# Patient Record
Sex: Male | Born: 1965
Health system: Southern US, Community
[De-identification: ages and names within clinical notes are randomized; demographics above are authoritative.]

## PROBLEM LIST (undated history)

## (undated) DIAGNOSIS — R519 Headache, unspecified: Secondary | ICD-10-CM

## (undated) DIAGNOSIS — M5416 Radiculopathy, lumbar region: Secondary | ICD-10-CM

## (undated) DIAGNOSIS — R51 Headache: Secondary | ICD-10-CM

## (undated) HISTORY — DX: Headache: R51

## (undated) HISTORY — PX: HAND TENDON SURGERY: SHX663

## (undated) HISTORY — DX: Headache, unspecified: R51.9

---

## 2016-08-23 HISTORY — PX: BACK SURGERY: SHX140

## 2017-02-12 ENCOUNTER — Other Ambulatory Visit (HOSPITAL_BASED_OUTPATIENT_CLINIC_OR_DEPARTMENT_OTHER): Payer: Self-pay | Admitting: Internal Medicine

## 2017-02-12 DIAGNOSIS — R51 Headache: Principal | ICD-10-CM

## 2017-02-12 DIAGNOSIS — R519 Headache, unspecified: Secondary | ICD-10-CM

## 2017-02-15 ENCOUNTER — Ambulatory Visit (HOSPITAL_BASED_OUTPATIENT_CLINIC_OR_DEPARTMENT_OTHER)
Admission: RE | Admit: 2017-02-15 | Discharge: 2017-02-15 | Disposition: A | Discharge status: Home / Self Care | Payer: 59 | Source: Ambulatory Visit | Attending: Internal Medicine | Admitting: Internal Medicine

## 2017-02-15 DIAGNOSIS — I6782 Cerebral ischemia: Secondary | ICD-10-CM | POA: Diagnosis not present

## 2017-02-15 DIAGNOSIS — R51 Headache: Secondary | ICD-10-CM | POA: Diagnosis not present

## 2017-02-15 DIAGNOSIS — R519 Headache, unspecified: Secondary | ICD-10-CM

## 2017-02-15 DIAGNOSIS — J349 Unspecified disorder of nose and nasal sinuses: Secondary | ICD-10-CM | POA: Insufficient documentation

## 2017-02-18 ENCOUNTER — Encounter: Payer: Self-pay | Admitting: Neurology

## 2017-02-18 ENCOUNTER — Ambulatory Visit (INDEPENDENT_AMBULATORY_CARE_PROVIDER_SITE_OTHER): Payer: 59 | Admitting: Neurology

## 2017-02-18 VITALS — BP 138/84 | HR 97 | Ht 73.0 in | Wt 167.0 lb

## 2017-02-18 DIAGNOSIS — G44019 Episodic cluster headache, not intractable: Secondary | ICD-10-CM | POA: Insufficient documentation

## 2017-02-18 MED ORDER — RIZATRIPTAN BENZOATE 10 MG PO TBDP: 10.0000 mg | ORAL_TABLET | ORAL | 6 refills | Status: DC | PRN

## 2017-02-18 MED ORDER — DIVALPROEX SODIUM ER 250 MG PO TB24: 250.0000 mg | ORAL_TABLET | Freq: Every day | ORAL | 11 refills | Status: DC

## 2017-02-18 NOTE — Progress Notes (Signed)
PATIENT: Zachary Mueller DOB: [birth year]  Chief Complaint  Patient presents with  . Headache    He started having left-sided headaches seven weeks ago.  The pain is worse at night and causes him difficulty sleeping.  He was initially treated for a sinus infection but his headaches did not improve.  He is now taking amitriptyline 25mg  at bedtime which has improved the frequency.  He has been using methocarbamol and oxycodone to treat his most severe pain (meds left over from his back surgery earlier this year).  . PCP    Terri PiedraForcucci, Courtney, PA-C     HISTORICAL  Zachary Mueller is a 51 years old male, seen in refer by PA Barry DienesForcucci, Courtney evaluation of headache, initial evaluation was on February 18, 2017.  I reviewed and summarized the referring note, he has history of anxiety, denied previous history of headache.  Since October 2018, without clear triggers, he develop current headaches, his headache wake  him up about 2 hours after sleep, around 1 AM, left side retro-orbital area severe pounding pain, radiating towards left temporal, left occipital region, there was no significant autonomic features, such as rhinorrhea, or tearing, headache last about 1 hour if he take hydrocodone, otherwise it can last for 4 hours,  At the beginning, he has headache almost every night, then about 4 times each week, on February 11, 2017, he was given prescription of amitriptyline 25 mg every night, he is now having headache about once every night,  In between the headache, he denies focal signs,  I have personally reviewed MRI of brain February 15, 2017, that was normal.  REVIEW OF SYSTEMS: Full 14 system review of systems performed and notable only for headache, fatigue  ALLERGIES: Allergies  Allergen Reactions  . Naproxen Anaphylaxis and Hives    HOME MEDICATIONS: Current Outpatient Medications  Medication Sig Dispense Refill  . amitriptyline (ELAVIL) 25 MG tablet Take 25 mg by mouth daily.  1    . IBUPROFEN PO Take by mouth as needed.    . methocarbamol (ROBAXIN) 500 MG tablet Take 500 mg by mouth as needed for muscle spasms.    Marland Kitchen. oxyCODONE-acetaminophen (PERCOCET/ROXICET) 5-325 MG tablet Take by mouth as needed for severe pain.    . Pseudoephedrine HCl (SUDAFED PO) Take by mouth as needed.     No current facility-administered medications for this visit.     PAST MEDICAL HISTORY: Past Medical History:  Diagnosis Date  . Headache     PAST SURGICAL HISTORY: Past Surgical History:  Procedure Laterality Date  . BACK SURGERY  08/2016  . HAND TENDON SURGERY Right     FAMILY HISTORY: Family History  Problem Relation Age of Onset  . Healthy Mother   . Healthy Father     SOCIAL HISTORY:  Social History   Socioeconomic History  . Marital status: Married    Spouse name: Not on file  . Number of children: 3  . Years of education: 7414  . Highest education level: Associate degree: academic program  Social Needs  . Financial resource strain: Not on file  . Food insecurity - worry: Not on file  . Food insecurity - inability: Not on file  . Transportation needs - medical: Not on file  . Transportation needs - non-medical: Not on file  Occupational History  . Occupation: Nurse, adultManufacturing engineer  Tobacco Use  . Smoking status: Current Every Day Smoker    Packs/day: 0.50    Types: Cigarettes  . Smokeless tobacco: Never  Used  Substance and Sexual Activity  . Alcohol use: Yes    Comment: 2 beers per day  . Drug use: No  . Sexual activity: Not on file  Other Topics Concern  . Not on file  Social History Narrative   Lives at home with his wife.   Right-handed.   4-5 cups of caffeine per day.     PHYSICAL EXAM   Vitals:   02/18/17 0850  BP: 138/84  Pulse: 97  Weight: 167 lb (75.8 kg)  Height: 6\' 1"  (1.854 m)    Not recorded      Body mass index is 22.03 kg/m.  PHYSICAL EXAMNIATION:  Gen: NAD, conversant, well nourised, obese, well groomed                      Cardiovascular: Regular rate rhythm, no peripheral edema, warm, nontender. Eyes: Conjunctivae clear without exudates or hemorrhage Neck: Supple, no carotid bruits. Pulmonary: Clear to auscultation bilaterally   NEUROLOGICAL EXAM:  MENTAL STATUS: Speech:    Speech is normal; fluent and spontaneous with normal comprehension.  Cognition:     Orientation to time, place and person     Normal recent and remote memory     Normal Attention span and concentration     Normal Language, naming, repeating,spontaneous speech     Fund of knowledge   CRANIAL NERVES: CN II: Visual fields are full to confrontation. Fundoscopic exam is normal with sharp discs and no vascular changes. Pupils are round equal and briskly reactive to light. CN III, IV, VI: extraocular movement are normal. No ptosis. CN V: Facial sensation is intact to pinprick in all 3 divisions bilaterally. Corneal responses are intact.  CN VII: Face is symmetric with normal eye closure and smile. CN VIII: Hearing is normal to rubbing fingers CN IX, X: Palate elevates symmetrically. Phonation is normal. CN XI: Head turning and shoulder shrug are intact CN XII: Tongue is midline with normal movements and no atrophy.  MOTOR: There is no pronator drift of out-stretched arms. Muscle bulk and tone are normal. Muscle strength is normal.  REFLEXES: Reflexes are 2+ and symmetric at the biceps, triceps, knees, and ankles. Plantar responses are flexor.  SENSORY: Intact to light touch, pinprick, positional sensation and vibratory sensation are intact in fingers and toes.  COORDINATION: Rapid alternating movements and fine finger movements are intact. There is no dysmetria on finger-to-nose and heel-knee-shin.    GAIT/STANCE: Posture is normal. Gait is steady with normal steps, base, arm swing, and turning. Heel and toe walking are normal. Tandem gait is normal.  Romberg is absent.   DIAGNOSTIC DATA (LABS, IMAGING, TESTING) - I  reviewed patient records, labs, notes, testing and imaging myself where available.   ASSESSMENT AND PLAN  Zachary Mueller is a 51 y.o. male   Left side headache,  Has cluster headache features,  Add on Depakote ER 250 mg every night, keep Elavil at 25 mg every night  Maxalt dissolvable as needed  Laboratory evaluations   Levert FeinsteinYijun Nayel Purdy, M.D. Ph.D.  [facility] [address], [address] [place], [place] [address] Ph: [phone]) [phone] Fax: [phone]  CC: Terri PiedraForcucci, Courtney, PA-C

## 2017-02-19 ENCOUNTER — Telehealth: Payer: Self-pay | Admitting: *Deleted

## 2017-02-19 LAB — CBC WITH DIFFERENTIAL/PLATELET
BASOS ABS: 0 10*3/uL (ref 0.0–0.2)
Basos: 1 %
EOS (ABSOLUTE): 0.4 10*3/uL (ref 0.0–0.4)
EOS: 6 %
HEMOGLOBIN: 16.1 g/dL (ref 13.0–17.7)
Hematocrit: 48.7 % (ref 37.5–51.0)
IMMATURE GRANS (ABS): 0 10*3/uL (ref 0.0–0.1)
IMMATURE GRANULOCYTES: 0 %
LYMPHS: 24 %
Lymphocytes Absolute: 1.5 10*3/uL (ref 0.7–3.1)
MCH: 31.1 pg (ref 26.6–33.0)
MCHC: 33.1 g/dL (ref 31.5–35.7)
MCV: 94 fL (ref 79–97)
MONOCYTES: 13 %
Monocytes Absolute: 0.8 10*3/uL (ref 0.1–0.9)
NEUTROS PCT: 56 %
Neutrophils Absolute: 3.5 10*3/uL (ref 1.4–7.0)
PLATELETS: 247 10*3/uL (ref 150–379)
RBC: 5.17 x10E6/uL (ref 4.14–5.80)
RDW: 14 % (ref 12.3–15.4)
WBC: 6.2 10*3/uL (ref 3.4–10.8)

## 2017-02-19 LAB — COMPREHENSIVE METABOLIC PANEL
ALBUMIN: 4.6 g/dL (ref 3.5–5.5)
ALT: 31 IU/L (ref 0–44)
AST: 35 IU/L (ref 0–40)
Albumin/Globulin Ratio: 2.3 — ABNORMAL HIGH (ref 1.2–2.2)
Alkaline Phosphatase: 82 IU/L (ref 39–117)
BILIRUBIN TOTAL: 0.2 mg/dL (ref 0.0–1.2)
BUN/Creatinine Ratio: 13 (ref 9–20)
BUN: 13 mg/dL (ref 6–24)
CALCIUM: 9.7 mg/dL (ref 8.7–10.2)
CO2: 24 mmol/L (ref 20–29)
Chloride: 102 mmol/L (ref 96–106)
Creatinine, Ser: 1.02 mg/dL (ref 0.76–1.27)
GFR calc Af Amer: 97 mL/min/{1.73_m2} (ref >59)
GFR, EST NON AFRICAN AMERICAN: 84 mL/min/{1.73_m2} (ref >59)
GLUCOSE: 73 mg/dL (ref 65–99)
Globulin, Total: 2 g/dL (ref 1.5–4.5)
POTASSIUM: 4.6 mmol/L (ref 3.5–5.2)
SODIUM: 143 mmol/L (ref 134–144)
TOTAL PROTEIN: 6.6 g/dL (ref 6.0–8.5)

## 2017-02-19 LAB — TSH: TSH: 0.954 u[IU]/mL (ref 0.450–4.500)

## 2017-02-19 LAB — C-REACTIVE PROTEIN: CRP: 0.4 mg/L (ref 0.0–4.9)

## 2017-02-19 LAB — SEDIMENTATION RATE: SED RATE: 2 mm/h (ref 0–30)

## 2017-02-19 NOTE — Telephone Encounter (Signed)
-----   Message from Levert FeinsteinYijun Yan, MD sent at 02/19/2017  1:16 PM EST ----- Please call patient for normal laboratory result

## 2017-02-19 NOTE — Telephone Encounter (Signed)
Spoke to patient he is aware of results.

## 2017-04-17 ENCOUNTER — Encounter: Payer: Self-pay | Admitting: Neurology

## 2017-04-17 ENCOUNTER — Ambulatory Visit (INDEPENDENT_AMBULATORY_CARE_PROVIDER_SITE_OTHER): Payer: 59 | Admitting: Neurology

## 2017-04-17 VITALS — BP 122/81 | HR 92 | Ht 73.0 in | Wt 169.0 lb

## 2017-04-17 DIAGNOSIS — G44019 Episodic cluster headache, not intractable: Secondary | ICD-10-CM | POA: Diagnosis not present

## 2017-04-17 MED ORDER — DIHYDROERGOTAMINE MESYLATE 4 MG/ML NA SOLN: 1.0000 | NASAL | 11 refills | Status: DC | PRN

## 2017-04-17 NOTE — Progress Notes (Signed)
PATIENT: Zachary Mueller DOB: 1965/09/15  Chief Complaint  Patient presents with  . Headache    His headache frequency has improved.  His last headache was the last week in November.  Maxalt was not helpful for him.  He is currently taking amitriptyline 27m at bedtime one day then the next day he takes Depakote ER 2560mat bedtime.     HISTORICAL  Zachary Mueller a [age] old male, seen in refer by PA FoLonia SkinnerCourtney evaluation of headache, initial evaluation was on February 18, 2017.  I reviewed and summarized the referring note, he has history of anxiety, denied previous history of headache.  Since October 2018, without clear triggers, he develop current headaches, his headache wake  him up about 2 hours after sleep, around 1 AM, left side retro-orbital area severe pounding pain, radiating towards left temporal, left occipital region, there was no significant autonomic features, such as rhinorrhea, or tearing, headache last about 1 hour if he take hydrocodone, otherwise it can last for 4 hours,  At the beginning, he has headache almost every night, then about 4 times each week, on February 11, 2017, he was given prescription of amitriptyline 25 mg every night, he is now having headache about once every night,  In between the headache, he denies focal signs,  I have personally reviewed MRI of brain February 15, 2017, that was normal.  UPDATE Apr 17 2017: Laboratory evaluation in November 2018 showed normal C-reactive protein and ESR TSH CMP, CBC  His headache overall has much improved, had a headache was the end of November, is still taking amitriptyline alternating with Depakote ER 250 mg every night, he has tried Maxalt as needed four times, it did not help his headache,  REVIEW OF SYSTEMS: Full 14 system review of systems performed and notable only for headache, fatigue  ALLERGIES: Allergies  Allergen Reactions  . Naproxen Anaphylaxis and Hives    HOME  MEDICATIONS: Current Outpatient Medications  Medication Sig Dispense Refill  . amitriptyline (ELAVIL) 25 MG tablet Take 25 mg by mouth daily.  1  . divalproex (DEPAKOTE ER) 250 MG 24 hr tablet Take 1 tablet (250 mg total) by mouth daily. 30 tablet 11  . IBUPROFEN PO Take by mouth as needed.    . methocarbamol (ROBAXIN) 500 MG tablet Take 500 mg by mouth as needed for muscle spasms.    . Marland KitchenxyCODONE-acetaminophen (PERCOCET/ROXICET) 5-325 MG tablet Take by mouth as needed for severe pain.    . Pseudoephedrine HCl (SUDAFED PO) Take by mouth as needed.     No current facility-administered medications for this visit.     PAST MEDICAL HISTORY: Past Medical History:  Diagnosis Date  . Headache     PAST SURGICAL HISTORY: Past Surgical History:  Procedure Laterality Date  . BACK SURGERY  08/2016  . HAND TENDON SURGERY Right     FAMILY HISTORY: Family History  Problem Relation Age of Onset  . Healthy Mother   . Healthy Father     SOCIAL HISTORY:  Social History   Socioeconomic History  . Marital status: Married    Spouse name: Not on file  . Number of children: 3  . Years of education: 1466. Highest education level: Associate degree: academic program  Social Needs  . Financial resource strain: Not on file  . Food insecurity - worry: Not on file  . Food insecurity - inability: Not on file  . Transportation needs - medical: Not on file  .  Transportation needs - non-medical: Not on file  Occupational History  . Occupation: Charity fundraiser  Tobacco Use  . Smoking status: Current Every Day Smoker    Packs/day: 0.50    Types: Cigarettes  . Smokeless tobacco: Never Used  Substance and Sexual Activity  . Alcohol use: Yes    Comment: 2 beers per day  . Drug use: No  . Sexual activity: Not on file  Other Topics Concern  . Not on file  Social History Narrative   Lives at home with his wife.   Right-handed.   4-5 cups of caffeine per day.     PHYSICAL EXAM    Vitals:   04/17/17 0910  BP: 122/81  Pulse: 92  Weight: 169 lb (76.7 kg)  Height: _0  (1.854 m)    Not recorded      Body mass index is 22.3 kg/m.  PHYSICAL EXAMNIATION:  Gen: NAD, conversant, well nourised, obese, well groomed                     Cardiovascular: Regular rate rhythm, no peripheral edema, warm, nontender. Eyes: Conjunctivae clear without exudates or hemorrhage Neck: Supple, no carotid bruits. Pulmonary: Clear to auscultation bilaterally   NEUROLOGICAL EXAM:  MENTAL STATUS: Speech:    Speech is normal; fluent and spontaneous with normal comprehension.  Cognition:     Orientation to time, place and person     Normal recent and remote memory     Normal Attention span and concentration     Normal Language, naming, repeating,spontaneous speech     Fund of knowledge   CRANIAL NERVES: CN II: Visual fields are full to confrontation. Fundoscopic exam is normal with sharp discs and no vascular changes. Pupils are round equal and briskly reactive to light. CN III, IV, VI: extraocular movement are normal. No ptosis. CN V: Facial sensation is intact to pinprick in all 3 divisions bilaterally. Corneal responses are intact.  CN VII: Face is symmetric with normal eye closure and smile. CN VIII: Hearing is normal to rubbing fingers CN IX, X: Palate elevates symmetrically. Phonation is normal. CN XI: Head turning and shoulder shrug are intact CN XII: Tongue is midline with normal movements and no atrophy.  MOTOR: There is no pronator drift of out-stretched arms. Muscle bulk and tone are normal. Muscle strength is normal.  REFLEXES: Reflexes are 2+ and symmetric at the biceps, triceps, knees, and ankles. Plantar responses are flexor.  SENSORY: Intact to light touch, pinprick, positional sensation and vibratory sensation are intact in fingers and toes.  COORDINATION: Rapid alternating movements and fine finger movements are intact. There is no dysmetria on  finger-to-nose and heel-knee-shin.    GAIT/STANCE: Posture is normal. Gait is steady with normal steps, base, arm swing, and turning. Heel and toe walking are normal. Tandem gait is normal.  Romberg is absent.   DIAGNOSTIC DATA (LABS, IMAGING, TESTING) - I reviewed patient records, labs, notes, testing and imaging myself where available.   ASSESSMENT AND PLAN  Jatavious Peppard is a 52 y.o. male   Left side headache,  With mixed feature of cluster headache and migraine  Has much improved  Tapering of Depakote ER 250 mg every night and Elavil at 25 mg every night  Maxalt did not help  Give him prescription of migranal nasal spray as needed   Marcial Pacas, M.D. Ph.D.  [facility] [address] [place], [place] [address] Ph: [phone] Fax: [phone]  CC: Forcucci,  Loma Sousa, PA-C

## 2017-04-23 ENCOUNTER — Encounter: Payer: Self-pay | Admitting: *Deleted

## 2017-04-23 ENCOUNTER — Telehealth: Payer: Self-pay | Admitting: *Deleted

## 2017-04-23 NOTE — Telephone Encounter (Signed)
Migranal nasal spray was denied but we have initiated an appeal with his insurance company.  Left message requesting a call back to notify him that is medication is under review for coverage.

## 2017-04-23 NOTE — Telephone Encounter (Signed)
Spoke to patient - he is aware that we have started an appeal for his Migranal.

## 2017-04-28 ENCOUNTER — Encounter: Payer: Self-pay | Admitting: *Deleted

## 2017-04-28 MED ORDER — SUMATRIPTAN SUCCINATE 100 MG PO TABS: ORAL_TABLET | ORAL | 5 refills | Status: DC

## 2017-04-28 NOTE — Addendum Note (Signed)
Addended by: Lilla ShookKIRKMAN, Chanler Schreiter C on: 04/28/2017 02:48 PM   Modules accepted: Orders

## 2017-04-28 NOTE — Telephone Encounter (Signed)
Migranal nasal spray denied after appeal.  His insurance plan requires him to try at least two oral triptans and he has only tried rizatriptan.  Per vo by Dr. Terrace ArabiaYan, provide rx for sumatriptan 100mg , per migraine protocol.  Called patient and he is agreeable to this change.  New rx sent to the pharmacy.

## 2017-10-15 ENCOUNTER — Ambulatory Visit: Payer: 59 | Admitting: Neurology

## 2018-05-20 IMAGING — MR MR HEAD W/O CM
10 of 11 series · 42 of 48 positions shown · non-contrast
Comparison: None.

CLINICAL DATA: 51 y/o M; 6 weeks of left temporal and parietal
headaches.

EXAM:
MRI HEAD WITHOUT CONTRAST
TECHNIQUE: Multiplanar, multiecho pulse sequences of the brain and surrounding
structures were obtained without intravenous contrast.

[Series 2: T1 · sagittal · 5.0mm · 0.49mm/px · 2 of 23 slices shown]
[im 1/23]
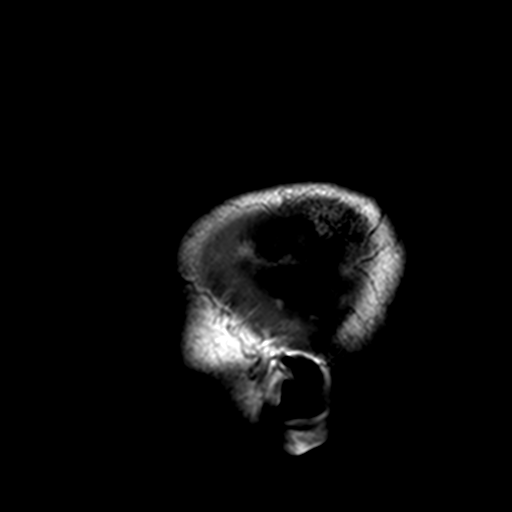
[im 23/23]
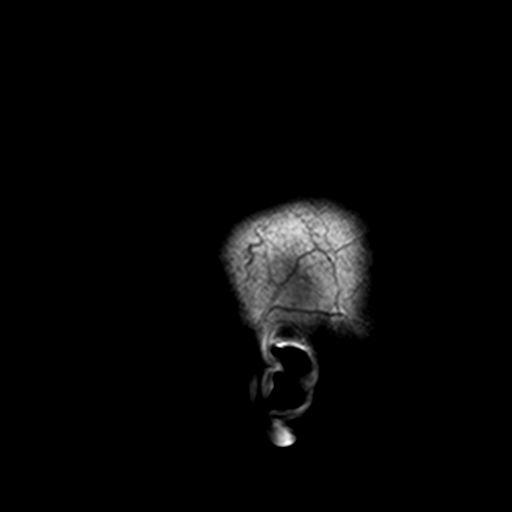

[Series 3: DWI · axial · 3.0mm · 2.19mm/px · z∈[-48,+104]mm · 9 of 94 slices shown (1 of 4)]
[im 1/94]
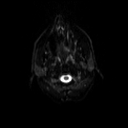
[im 12/94]
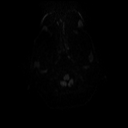
[im 24/94]
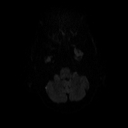
[im 35/94]
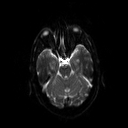
[im 47/94]
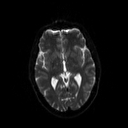
[im 59/94]
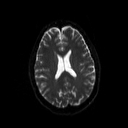
[im 70/94]
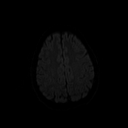
[im 82/94]
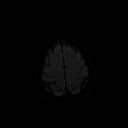
[im 94/94]
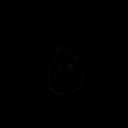

[Series 4: DWI · axial · 3.0mm · 2.19mm/px · z∈[-48,+104]mm · 5 of 47 slices shown (2 of 4)]
[im 1/47]
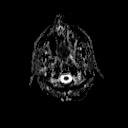
[im 12/47]
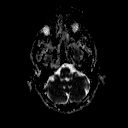
[im 24/47]
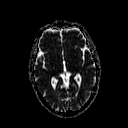
[im 35/47]
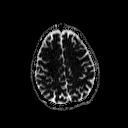
[im 47/47]
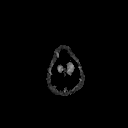

[Series 5: DWI · coronal · 3.0mm · 1.46mm/px · 10 of 104 slices shown (3 of 4)]
[im 1/104]
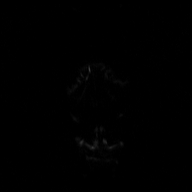
[im 12/104]
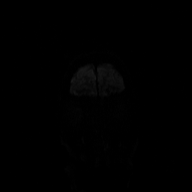
[im 23/104]
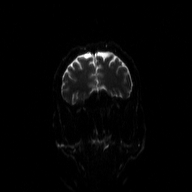
[im 35/104]
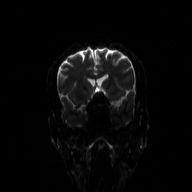
[im 46/104]
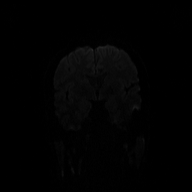
[im 58/104]
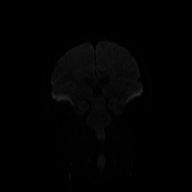
[im 69/104]
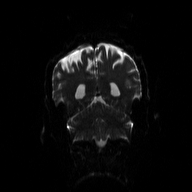
[im 81/104]
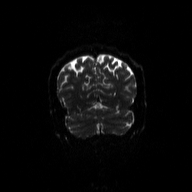
[im 92/104]
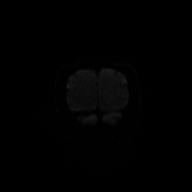
[im 104/104]
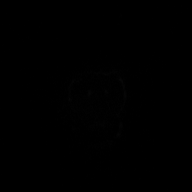

[Series 6: DWI · coronal · 3.0mm · 1.46mm/px · 5 of 52 slices shown (4 of 4)]
[im 1/52]
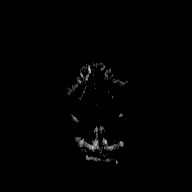
[im 13/52]
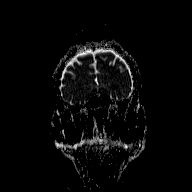
[im 26/52]
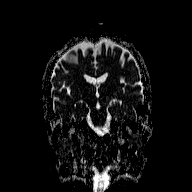
[im 39/52]
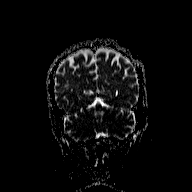
[im 52/52]
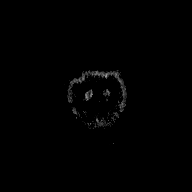

[Series 7: T2 · axial · 5.0mm · 0.49mm/px · z∈[-51,+103]mm · 2 of 23 slices shown (1 of 2)]
[im 1/23]
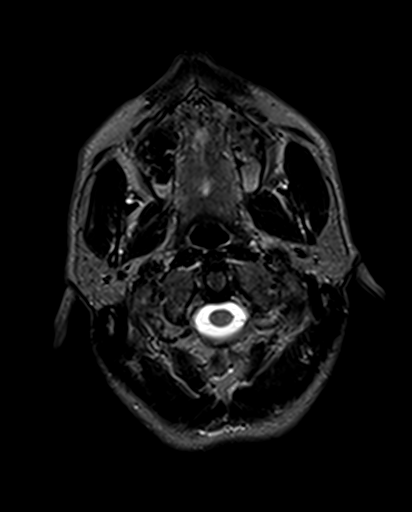
[im 23/23]
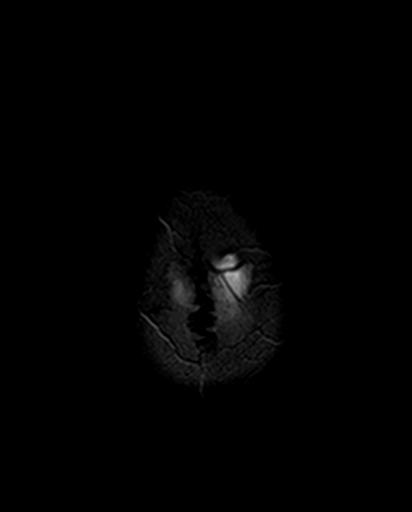

[Series 8: T2 · axial · 5.0mm · 0.49mm/px · z∈[-51,+103]mm · 2 of 23 slices shown (2 of 2)]
[im 1/23]
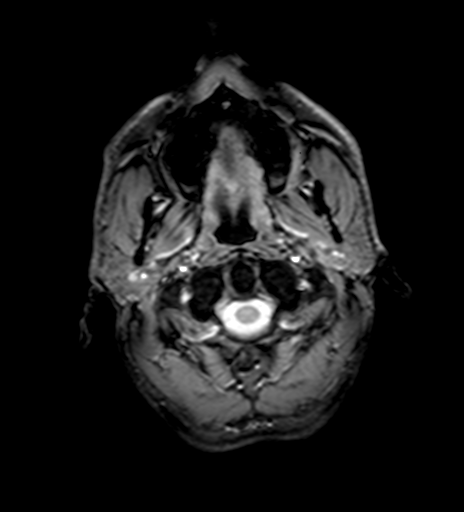
[im 23/23]
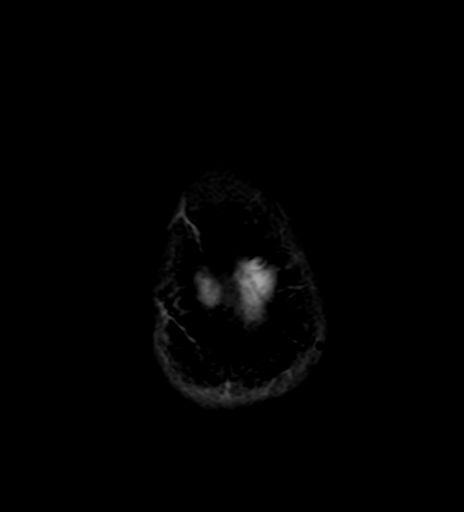

[Series 9: FLAIR · axial · 3.0mm · 0.49mm/px · z∈[-52,+104]mm · 3 of 27 slices shown]
[im 1/27]
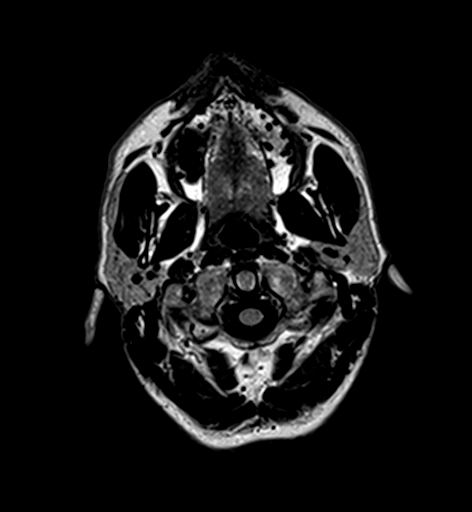
[im 14/27]
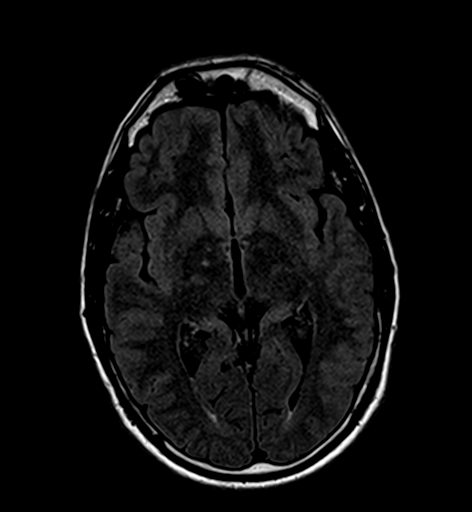
[im 27/27]
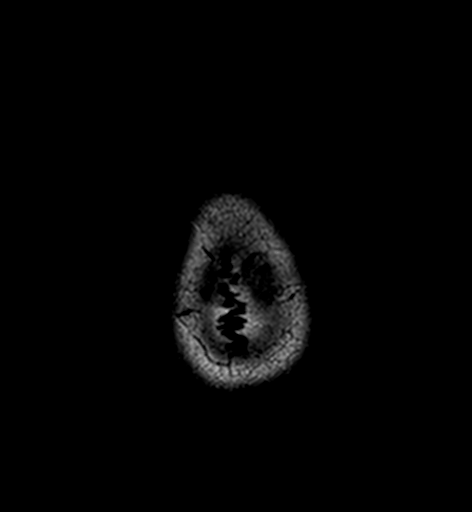

[Series 11: T2 post-contrast · coronal · 5.0mm · 0.45mm/px · 3 of 29 slices shown]
[im 1/29]
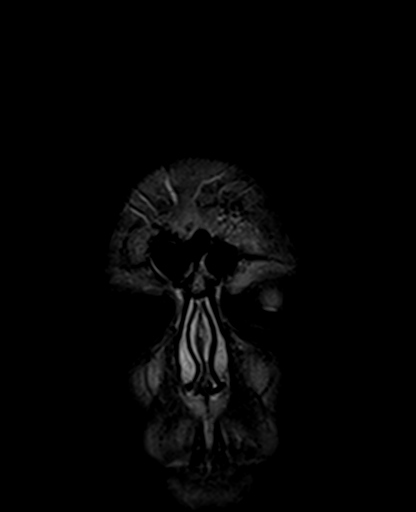
[im 15/29]
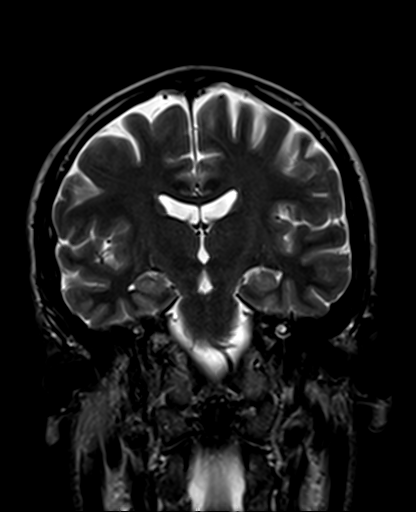
[im 29/29]
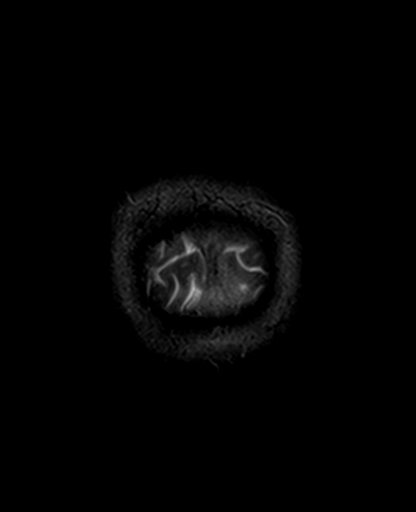

[Series 100: hx · axial · 10.0mm · 0.55mm/px · 1 of 9 slices shown]
[im 1/9]
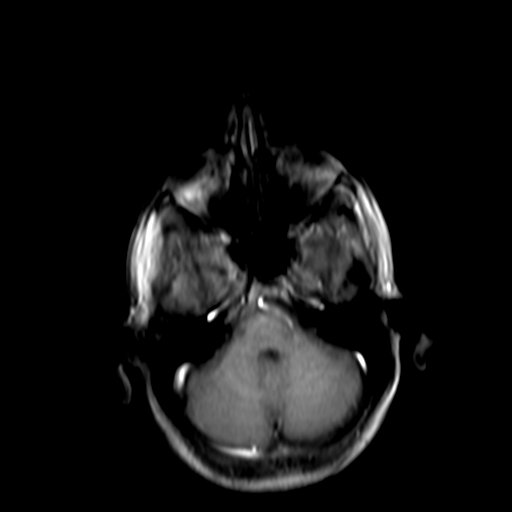

[42 of 48 positions shown; findings below may reference images not displayed]

FINDINGS: Brain: No acute infarction, hemorrhage, hydrocephalus, extra-axial
collection or mass lesion. Few punctate foci of T2 FLAIR
hyperintense signal abnormality in right lentiform nucleus and left
medial cerebellum are nonspecific but probably represent mild
chronic microvascular ischemic changes.

Vascular: Normal flow voids.

Skull and upper cervical spine: Normal marrow signal.

Sinuses/Orbits: Mild paranasal sinus mucosal thickening. Otherwise
negative.

Other: None.
IMPRESSION: 1. No acute intracranial abnormality identified.
2. Mild chronic microvascular ischemic changes of the brain.
3. Mild paranasal sinus disease.

By: Kenjee Liger M.D.

## 2020-01-17 DIAGNOSIS — M5416 Radiculopathy, lumbar region: Secondary | ICD-10-CM | POA: Diagnosis not present

## 2020-01-24 DIAGNOSIS — M5416 Radiculopathy, lumbar region: Secondary | ICD-10-CM | POA: Diagnosis not present

## 2020-03-07 DIAGNOSIS — M5416 Radiculopathy, lumbar region: Secondary | ICD-10-CM | POA: Diagnosis not present

## 2020-03-21 DIAGNOSIS — M5416 Radiculopathy, lumbar region: Secondary | ICD-10-CM | POA: Diagnosis not present

## 2020-06-08 DIAGNOSIS — M5416 Radiculopathy, lumbar region: Secondary | ICD-10-CM | POA: Diagnosis not present

## 2020-06-23 DIAGNOSIS — M5416 Radiculopathy, lumbar region: Secondary | ICD-10-CM | POA: Diagnosis not present

## 2020-09-21 DIAGNOSIS — Z23 Encounter for immunization: Secondary | ICD-10-CM | POA: Diagnosis not present

## 2020-09-21 DIAGNOSIS — F419 Anxiety disorder, unspecified: Secondary | ICD-10-CM | POA: Diagnosis not present

## 2020-09-21 DIAGNOSIS — Z13220 Encounter for screening for lipoid disorders: Secondary | ICD-10-CM | POA: Diagnosis not present

## 2020-09-21 DIAGNOSIS — Z125 Encounter for screening for malignant neoplasm of prostate: Secondary | ICD-10-CM | POA: Diagnosis not present

## 2020-09-21 DIAGNOSIS — Z1211 Encounter for screening for malignant neoplasm of colon: Secondary | ICD-10-CM | POA: Diagnosis not present

## 2020-09-21 DIAGNOSIS — Z0000 Encounter for general adult medical examination without abnormal findings: Secondary | ICD-10-CM | POA: Diagnosis not present

## 2020-10-26 DIAGNOSIS — G44009 Cluster headache syndrome, unspecified, not intractable: Secondary | ICD-10-CM | POA: Diagnosis not present

## 2020-11-29 ENCOUNTER — Ambulatory Visit (INDEPENDENT_AMBULATORY_CARE_PROVIDER_SITE_OTHER): Payer: BC Managed Care – PPO | Admitting: Neurology

## 2020-11-29 ENCOUNTER — Encounter: Payer: Self-pay | Admitting: Neurology

## 2020-11-29 VITALS — BP 122/87 | HR 74 | Ht 73.0 in | Wt 180.0 lb

## 2020-11-29 DIAGNOSIS — G43709 Chronic migraine without aura, not intractable, without status migrainosus: Secondary | ICD-10-CM | POA: Diagnosis not present

## 2020-11-29 MED ORDER — NORTRIPTYLINE HCL 25 MG PO CAPS: 50.0000 mg | ORAL_CAPSULE | Freq: Every day | ORAL | 11 refills | Status: DC

## 2020-11-29 MED ORDER — RIZATRIPTAN BENZOATE 10 MG PO TABS: ORAL_TABLET | ORAL | 11 refills | Status: DC

## 2020-11-29 NOTE — Progress Notes (Signed)
Chief Complaint  Patient presents with   New Patient (Initial Visit)    New room, alone here to discuss cluster headaches states he is taking verapamil bid, that has decreased the frequency of headaches       ASSESSMENT AND PLAN  Zachary Mueller is a 55 y.o. male Chronic migraine headache  Already on verapamil 80 mg 3 times a day as preventive medication  Add on nortriptyline 25 mg titrating to 50 mg every night  Maxalt as needed, may mix with Tylenol, and Flexeril for prolonged severe headache (he is allergic to Aleve,)     DIAGNOSTIC DATA (LABS, IMAGING, TESTING) - I reviewed patient records, labs, notes, testing and imaging myself where available. Laboratory evaluation in June 2022, normal CBC, hemoglobin of 16, CMP creatinine of 0.85, LDL was elevated 160,  MEDICAL HISTORY:  Zachary Mueller, is a 55 year old male, seen in request by his primary care PA Tera Helper for evaluation of chronic migraine headache  I reviewed and summarized the referring note.  I saw him in November 2018 for a cluster of significant headache, at that time, he described nightly left side severe pounding headache couple hours after he falling to sleep, woke him up, but there was no autonomic features, it was improved by amitriptyline and Depakote as preventive medication  I personally reviewed MRI of the brain without contrast November 2018 that was normal  He tried Imitrex at that time, was not sure about the benefit  He was headache free since then, until October 03 2020, he began to have headaches again, initially it was intermittent left-sided pressure headache, a week later, he began to have nightly severe pounding headache with light noise sensitivity, no nausea, lasting for couple hours, no autonomic symptoms, when he has headache, he wants to keep still and quiet, instead of pacing around, there was no left scalp allodynia  He was seen by primary care physician end of July, was started on  verapamil 80 mg 3 times a day as preventive medication, giving Maxalt 10 mg as needed, which has helped his headache, he reported 75% improvement with Maxalt Cut back to severity and duration of the headache, no significant side effect noted.  Last headache he had was 3 weeks ago,  Laboratory evaluation in June 2022, showed normal CBC, CMP  PHYSICAL EXAM:   Vitals:   11/29/20 0741  BP: 122/87  Pulse: 74  Weight: 180 lb (81.6 kg)  Height: 6\' 1"  (1.854 m)   Not recorded     Body mass index is 23.75 kg/m.  PHYSICAL EXAMNIATION:  Gen: NAD, conversant, well nourised, well groomed                     Cardiovascular: Regular rate rhythm, no peripheral edema, warm, nontender. Eyes: Conjunctivae clear without exudates or hemorrhage Neck: Supple, no carotid bruits. Pulmonary: Clear to auscultation bilaterally   NEUROLOGICAL EXAM:  MENTAL STATUS: Speech:    Speech is normal; fluent and spontaneous with normal comprehension.  Cognition:     Orientation to time, place and person     Normal recent and remote memory     Normal Attention span and concentration     Normal Language, naming, repeating,spontaneous speech     Fund of knowledge   CRANIAL NERVES: CN II: Visual fields are full to confrontation. Pupils are round equal and briskly reactive to light. CN III, IV, VI: extraocular movement are normal. No ptosis. CN V: Facial sensation is intact to  light touch CN VII: Face is symmetric with normal eye closure  CN VIII: Hearing is normal to causal conversation. CN IX, X: Phonation is normal. CN XI: Head turning and shoulder shrug are intact  MOTOR: There is no pronator drift of out-stretched arms. Muscle bulk and tone are normal. Muscle strength is normal.  REFLEXES: Reflexes are 2+ and symmetric at the biceps, triceps, knees, and ankles. Plantar responses are flexor.  SENSORY: Intact to light touch, pinprick and vibratory sensation are intact in fingers and  toes.  COORDINATION: There is no trunk or limb dysmetria noted.  GAIT/STANCE: Posture is normal. Gait is steady with normal steps, base, arm swing, and turning. Heel and toe walking are normal. Tandem gait is normal.  Romberg is absent.  REVIEW OF SYSTEMS:  Full 14 system review of systems performed and notable only for as above All other review of systems were negative.   ALLERGIES: Allergies  Allergen Reactions   Naproxen Anaphylaxis and Hives    HOME MEDICATIONS: Current Outpatient Medications  Medication Sig Dispense Refill   cyclobenzaprine (FLEXERIL) 10 MG tablet Take 10 mg by mouth 3 (three) times daily as needed for muscle spasms.     IBUPROFEN PO Take by mouth as needed.     rizatriptan (MAXALT) 10 MG tablet Take 10 mg by mouth 2 (two) times daily as needed.     SUMAtriptan (IMITREX) 100 MG tablet Take 1 tab at onset of migraine.  May repeat in 2 hrs, if needed.  Max dose: 2 tabs/day. This is a 30 day prescription. 12 tablet 5   verapamil (CALAN) 80 MG tablet Take 80 mg by mouth 3 (three) times daily.     No current facility-administered medications for this visit.    PAST MEDICAL HISTORY: Past Medical History:  Diagnosis Date   Headache     PAST SURGICAL HISTORY: Past Surgical History:  Procedure Laterality Date   BACK SURGERY  08/2016   HAND TENDON SURGERY Right     FAMILY HISTORY: Family History  Problem Relation Age of Onset   Healthy Mother    Healthy Father     SOCIAL HISTORY: Social History   Socioeconomic History   Marital status: Married    Spouse name: Not on file   Number of children: 3   Years of education: 14   Highest education level: Associate degree: academic program  Occupational History   Occupation: Nurse, adult  Tobacco Use   Smoking status: Every Day    Packs/day: 0.50    Types: Cigarettes   Smokeless tobacco: Never  Vaping Use   Vaping Use: Never used  Substance and Sexual Activity   Alcohol use: Yes     Comment: 2 beers per day   Drug use: No   Sexual activity: Not on file  Other Topics Concern   Not on file  Social History Narrative   Lives at home with his wife.   Right-handed.   4-5 cups of caffeine per day.   Social Determinants of Health   Financial Resource Strain: Not on file  Food Insecurity: Not on file  Transportation Needs: Not on file  Physical Activity: Not on file  Stress: Not on file  Social Connections: Not on file  Intimate Partner Violence: Not on file      Levert Feinstein, M.D. Ph.D.  [facility] [address] [place], [place] [address] Ph: [phone] Fax: [phone]  CC:  Verl Blalock [address] [place]  [place],  [place] [address]  Shirleen Schirmer, PA-C

## 2020-11-29 NOTE — Patient Instructions (Addendum)
  Keep Verapamil 80mg  tid for 3 more weeks.        rizatriptan (MAXALT) 10 MG tablet    Sig: Take 1 tab at onset of migraine.  May repeat in 2 hrs, if needed.  Max dose: 2 tabs/day. This is a 30 day prescription.    Dispense:  12 tablet    Refill:  11   nortriptyline (PAMELOR) 25 MG capsule    Sig: Take 2 capsules (50 mg total) by mouth at bedtime.    Dispense:  60 capsule    Refill:  11     May combine maxalt  flexeril as needed for prolonged headache

## 2020-12-27 ENCOUNTER — Other Ambulatory Visit: Payer: Self-pay | Admitting: Neurology

## 2021-09-03 ENCOUNTER — Other Ambulatory Visit: Payer: Self-pay | Admitting: Orthopedic Surgery

## 2021-09-18 ENCOUNTER — Other Ambulatory Visit: Payer: Self-pay

## 2021-09-18 ENCOUNTER — Encounter (HOSPITAL_COMMUNITY)
Admission: RE | Admit: 2021-09-18 | Discharge: 2021-09-18 | Disposition: A | Discharge status: Home / Self Care | Payer: BC Managed Care – PPO | Source: Ambulatory Visit | Attending: Orthopedic Surgery | Admitting: Orthopedic Surgery

## 2021-09-18 ENCOUNTER — Encounter (HOSPITAL_COMMUNITY): Payer: Self-pay

## 2021-09-18 ENCOUNTER — Inpatient Hospital Stay (HOSPITAL_COMMUNITY): Admission: RE | Admit: 2021-09-18 | Payer: 59 | Source: Ambulatory Visit

## 2021-09-18 VITALS — BP 133/85 | HR 81 | Temp 98.0°F | Resp 17 | Ht 73.0 in | Wt 177.0 lb

## 2021-09-18 DIAGNOSIS — Z01818 Encounter for other preprocedural examination: Secondary | ICD-10-CM

## 2021-09-18 DIAGNOSIS — Z01812 Encounter for preprocedural laboratory examination: Secondary | ICD-10-CM | POA: Diagnosis present

## 2021-09-18 HISTORY — DX: Radiculopathy, lumbar region: M54.16

## 2021-09-18 LAB — CBC
HCT: 43 % (ref 39.0–52.0)
Hemoglobin: 14.6 g/dL (ref 13.0–17.0)
MCH: 31 pg (ref 26.0–34.0)
MCHC: 34 g/dL (ref 30.0–36.0)
MCV: 91.3 fL (ref 80.0–100.0)
Platelets: 245 10*3/uL (ref 150–400)
RBC: 4.71 MIL/uL (ref 4.22–5.81)
RDW: 12.4 % (ref 11.5–15.5)
WBC: 5.9 10*3/uL (ref 4.0–10.5)
nRBC: 0 % (ref 0.0–0.2)

## 2021-09-18 LAB — TYPE AND SCREEN
ABO/RH(D): O POS
Antibody Screen: NEGATIVE

## 2021-09-18 LAB — SURGICAL PCR SCREEN
MRSA, PCR: NEGATIVE
Staphylococcus aureus: NEGATIVE

## 2021-10-01 ENCOUNTER — Inpatient Hospital Stay (HOSPITAL_COMMUNITY): Admission: RE | Admit: 2021-10-01 | Payer: Self-pay | Source: Ambulatory Visit

## 2021-10-03 ENCOUNTER — Encounter (HOSPITAL_COMMUNITY): Admission: RE | Payer: Self-pay | Source: Home / Self Care

## 2021-10-03 ENCOUNTER — Inpatient Hospital Stay (HOSPITAL_COMMUNITY)
Admission: RE | Admit: 2021-10-03 | Payer: BC Managed Care – PPO | Source: Home / Self Care | Admitting: Orthopedic Surgery

## 2021-10-03 SURGERY — ANTERIOR LATERAL LUMBAR FUSION 1 LEVEL
Anesthesia: General | Laterality: Right

## 2021-12-18 ENCOUNTER — Other Ambulatory Visit: Payer: Self-pay | Admitting: Orthopedic Surgery

## 2022-01-03 NOTE — Pre-Procedure Instructions (Signed)
Surgical Instructions    Your procedure is scheduled on Wednesday October 18.  Report to [facility] Main Entrance "A" at 6:30 A.M., then check in with the Admitting office.  Call this number if you have problems the morning of surgery:  [phone]  If you have any questions prior to your surgery date call [phone]: Open Monday-Friday 8am-4pm  If you experience any cold or flu symptoms such as cough, fever, chills, shortness of breath, etc. between now and your scheduled surgery, please notify us at the above number     Remember:  Do not eat after midnight the night before your surgery  You may drink clear liquids until 5:30 the morning of your surgery.   Clear liquids allowed are: Water, Non-Citrus Juices (without pulp), Carbonated Beverages, Clear Tea, Black Coffee ONLY (NO MILK, CREAM OR POWDERED CREAMER of any kind), and Gatorade   Patient Instructions  The night before surgery:  No food after midnight. ONLY clear liquids after midnight  The day of surgery (if you do NOT have diabetes):  Drink ONE (1) Pre-Surgery Clear Ensure by 5:30 the morning of surgery. Drink in one sitting. Do not sip.  This drink was given to you during your hospital pre-op appointment visit.  Nothing else to drink after completing the Pre-Surgery Clear Ensure.         If you have questions, please contact your surgeon's office.    Take these medicines the morning of surgery with A SIP OF WATER IF NEEDED: ALPRAZolam (XANAX) cyclobenzaprine (FLEXERIL)  As of today, STOP taking any Aspirin (unless otherwise instructed by your surgeon) Aleve, Naproxen, Ibuprofen, Motrin, Advil, Goody's, BC's, all herbal medications, fish oil, and all vitamins. THIS INCLUDES meloxicam (MOBIC).          Do NOT Smoke (Tobacco/Vaping)  24 hours prior to your procedure  If you use a CPAP at night, you may bring your mask for your overnight stay.   Contacts, glasses, hearing aids, dentures or partials may not be worn  into surgery, please bring cases for these belongings   For patients admitted to the hospital, discharge time will be determined by your treatment team.   Patients discharged the day of surgery will not be allowed to drive home, and someone needs to stay with them for 24 hours.   SURGICAL WAITING ROOM VISITATION Patients having surgery or a procedure may have no more than 2 support people in the waiting area - these visitors may rotate.   Children under the age of 47 must have an adult with them who is not the patient. If the patient needs to stay at the hospital during part of their recovery, the visitor guidelines for inpatient rooms apply. Pre-op nurse will coordinate an appropriate time for 1 support person to accompany patient in pre-op.  This support person may not rotate.   Please refer to the [facility] website for the visitor guidelines for Inpatients (after your surgery is over and you are in a regular room).    Special instructions:    Oral Hygiene is also important to reduce your risk of infection.  Remember - BRUSH YOUR TEETH THE MORNING OF SURGERY WITH YOUR REGULAR TOOTHPASTE   [facility]- Preparing For Surgery  Before surgery, you can play an important role. Because skin is not sterile, your skin needs to be as free of germs as possible. You can reduce the number of germs on your skin by washing with CHG (chlorahexidine gluconate) Soap before surgery.  CHG  is an antiseptic cleaner which kills germs and bonds with the skin to continue killing germs even after washing.     Please do not use if you have an allergy to CHG or antibacterial soaps. If your skin becomes reddened/irritated stop using the CHG.  Do not shave (including legs and underarms) for at least 48 hours prior to first CHG shower. It is OK to shave your face.  Please follow these instructions carefully.     Shower the NIGHT BEFORE SURGERY and the MORNING OF SURGERY with CHG Soap.   If you chose to wash  your hair, wash your hair first as usual with your normal shampoo. After you shampoo, rinse your hair and body thoroughly to remove the shampoo.  Then [employer] and genitals (private parts) with your normal soap and rinse thoroughly to remove soap.  After that Use CHG Soap as you would any other liquid soap. You can apply CHG directly to the skin and wash gently with a scrungie or a clean washcloth.   Apply the CHG Soap to your body ONLY FROM THE NECK DOWN.  Do not use on open wounds or open sores. Avoid contact with your eyes, ears, mouth and genitals (private parts). Wash Face and genitals (private parts)  with your normal soap.   Wash thoroughly, paying special attention to the area where your surgery will be performed.  Thoroughly rinse your body with warm water from the neck down.  DO NOT shower/wash with your normal soap after using and rinsing off the CHG Soap.  Pat yourself dry with a CLEAN TOWEL.  Wear CLEAN PAJAMAS to bed the night before surgery  Place CLEAN SHEETS on your bed the night before your surgery  DO NOT SLEEP WITH PETS.   Day of Surgery:  Take a shower with CHG soap. Wear Clean/Comfortable clothing the morning of surgery Remember to brush your teeth WITH YOUR REGULAR TOOTHPASTE. Do not wear jewelry. Do not wear lotions, powders, cologne or deodorant. Men may shave face and neck. Do not bring valuables to the hospital. [facility] is not responsible for any belongings or valuables.    If you received a COVID test during your pre-op visit, it is requested that you wear a mask when out in public, stay away from anyone that may not be feeling well, and notify your surgeon if you develop symptoms. If you have been in contact with anyone that has tested positive in the last 10 days, please notify your surgeon.    Please read over the following fact sheets that you were given.

## 2022-01-04 ENCOUNTER — Encounter (HOSPITAL_COMMUNITY)
Admission: RE | Admit: 2022-01-04 | Discharge: 2022-01-04 | Disposition: A | Discharge status: Home / Self Care | Payer: BC Managed Care – PPO | Source: Ambulatory Visit | Attending: Orthopedic Surgery | Admitting: Orthopedic Surgery

## 2022-01-04 ENCOUNTER — Other Ambulatory Visit: Payer: Self-pay

## 2022-01-04 ENCOUNTER — Encounter (HOSPITAL_COMMUNITY): Payer: Self-pay

## 2022-01-04 VITALS — BP 139/86 | HR 86 | Temp 98.6°F | Resp 17 | Ht 73.0 in | Wt 178.4 lb

## 2022-01-04 DIAGNOSIS — Z01812 Encounter for preprocedural laboratory examination: Secondary | ICD-10-CM | POA: Diagnosis present

## 2022-01-04 DIAGNOSIS — Z789 Other specified health status: Secondary | ICD-10-CM

## 2022-01-04 DIAGNOSIS — Z01818 Encounter for other preprocedural examination: Secondary | ICD-10-CM

## 2022-01-04 LAB — COMPREHENSIVE METABOLIC PANEL
ALT: 32 U/L (ref 0–44)
AST: 34 U/L (ref 15–41)
Albumin: 4.3 g/dL (ref 3.5–5.0)
Alkaline Phosphatase: 69 U/L (ref 38–126)
Anion gap: 4 — ABNORMAL LOW (ref 5–15)
BUN: 11 mg/dL (ref 6–20)
CO2: 31 mmol/L (ref 22–32)
Calcium: 9.4 mg/dL (ref 8.9–10.3)
Chloride: 104 mmol/L (ref 98–111)
Creatinine, Ser: 0.91 mg/dL (ref 0.61–1.24)
GFR, Estimated: >60 mL/min (ref >60)
Glucose, Bld: 102 mg/dL — ABNORMAL HIGH (ref 70–99)
Potassium: 4 mmol/L (ref 3.5–5.1)
Sodium: 139 mmol/L (ref 135–145)
Total Bilirubin: 0.6 mg/dL (ref 0.3–1.2)
Total Protein: 6.9 g/dL (ref 6.5–8.1)

## 2022-01-04 LAB — CBC
HCT: 45 % (ref 39.0–52.0)
Hemoglobin: 15.8 g/dL (ref 13.0–17.0)
MCH: 32 pg (ref 26.0–34.0)
MCHC: 35.1 g/dL (ref 30.0–36.0)
MCV: 91.3 fL (ref 80.0–100.0)
Platelets: 251 10*3/uL (ref 150–400)
RBC: 4.93 MIL/uL (ref 4.22–5.81)
RDW: 12.6 % (ref 11.5–15.5)
WBC: 6.5 10*3/uL (ref 4.0–10.5)
nRBC: 0 % (ref 0.0–0.2)

## 2022-01-04 LAB — SURGICAL PCR SCREEN
MRSA, PCR: NEGATIVE
Staphylococcus aureus: NEGATIVE

## 2022-01-04 LAB — TYPE AND SCREEN
ABO/RH(D): O POS
Antibody Screen: NEGATIVE

## 2022-01-04 NOTE — Progress Notes (Addendum)
PCP - Ammie Dalton, PA-C Cardiologist - denies  PPM/ICD - denies Device Orders - n/a Rep Notified - n/a  Chest x-ray - n/a EKG - 20 years ago per pt Stress Test - denies ECHO - denies Cardiac Cath - denies  Sleep Study - denies CPAP - n/a  DM-pt denies Blood Thinner Instructions:denies Aspirin Instructions:denies   ERAS Protcol -yes PRE-SURGERY Ensure given at PAT   COVID TEST- n/a   Anesthesia review: NO  Patient denies shortness of breath, fever, cough and chest pain at PAT appointment

## 2022-01-08 NOTE — Progress Notes (Signed)
Patient's surgery was rescheduled for tomorrow morning at 08:25 o'clock. Patient was in PAT on 01/04/22 where he received instructions for the surgery day and he answer questions regarding his medical/surgical history.  Today patient was called to follow the instructions he received in PAT for tomorrow. Patient wasn't available and this writer left him a message on [phone] with the instructions and the arrival time. Also, patient's wife was called but she wasn't available.

## 2022-01-09 ENCOUNTER — Inpatient Hospital Stay (HOSPITAL_COMMUNITY): Payer: BC Managed Care – PPO

## 2022-01-09 ENCOUNTER — Inpatient Hospital Stay (HOSPITAL_COMMUNITY): Payer: BC Managed Care – PPO | Admitting: Anesthesiology

## 2022-01-09 ENCOUNTER — Inpatient Hospital Stay (HOSPITAL_COMMUNITY)
Admission: RE | Disposition: A | Discharge status: Home / Self Care | Payer: Self-pay | Source: Home / Self Care | Attending: Orthopedic Surgery

## 2022-01-09 ENCOUNTER — Encounter (HOSPITAL_COMMUNITY): Payer: Self-pay | Admitting: Orthopedic Surgery

## 2022-01-09 ENCOUNTER — Other Ambulatory Visit: Payer: Self-pay

## 2022-01-09 ENCOUNTER — Inpatient Hospital Stay (HOSPITAL_COMMUNITY)
Admission: RE | Admit: 2022-01-09 | Discharge: 2022-01-10 | DRG: 454 | Disposition: A | Discharge status: Home / Self Care | Payer: BC Managed Care – PPO | Attending: Orthopedic Surgery | Admitting: Orthopedic Surgery

## 2022-01-09 DIAGNOSIS — Z791 Long term (current) use of non-steroidal anti-inflammatories (NSAID): Secondary | ICD-10-CM

## 2022-01-09 DIAGNOSIS — M5410 Radiculopathy, site unspecified: Principal | ICD-10-CM | POA: Diagnosis present

## 2022-01-09 DIAGNOSIS — M4856XA Collapsed vertebra, not elsewhere classified, lumbar region, initial encounter for fracture: Secondary | ICD-10-CM | POA: Diagnosis present

## 2022-01-09 DIAGNOSIS — Z886 Allergy status to analgesic agent status: Secondary | ICD-10-CM

## 2022-01-09 DIAGNOSIS — Z87891 Personal history of nicotine dependence: Secondary | ICD-10-CM | POA: Diagnosis not present

## 2022-01-09 DIAGNOSIS — M48061 Spinal stenosis, lumbar region without neurogenic claudication: Secondary | ICD-10-CM | POA: Diagnosis present

## 2022-01-09 DIAGNOSIS — M5416 Radiculopathy, lumbar region: Secondary | ICD-10-CM | POA: Diagnosis present

## 2022-01-09 DIAGNOSIS — R519 Headache, unspecified: Secondary | ICD-10-CM | POA: Diagnosis present

## 2022-01-09 HISTORY — PX: ANTERIOR LAT LUMBAR FUSION: SHX1168

## 2022-01-09 SURGERY — ANTERIOR LATERAL LUMBAR FUSION 1 LEVEL
Anesthesia: General | Laterality: Right

## 2022-01-09 MED ORDER — PHENOL 1.4 % MT LIQD: 1.0000 | OROMUCOSAL | Status: DC | PRN

## 2022-01-09 MED ORDER — ONDANSETRON HCL 4 MG PO TABS: 4.0000 mg | ORAL_TABLET | Freq: Four times a day (QID) | ORAL | Status: DC | PRN

## 2022-01-09 MED ORDER — DOCUSATE SODIUM 100 MG PO CAPS
100.0000 mg | ORAL_CAPSULE | Freq: Two times a day (BID) | ORAL | Status: DC
  Administered 2022-01-09 – 2022-01-10 (×3): 100 mg via ORAL
  Filled 2022-01-09 (×3): qty 1

## 2022-01-09 MED ORDER — BUPIVACAINE LIPOSOME 1.3 % IJ SUSP
INTRAMUSCULAR | Status: AC
  Filled 2022-01-09: qty 20

## 2022-01-09 MED ORDER — PROPOFOL 1000 MG/100ML IV EMUL
INTRAVENOUS | Status: AC
  Filled 2022-01-09: qty 100

## 2022-01-09 MED ORDER — MIDAZOLAM HCL 2 MG/2ML IJ SOLN
INTRAMUSCULAR | Status: AC
  Filled 2022-01-09: qty 2

## 2022-01-09 MED ORDER — ZOLPIDEM TARTRATE 5 MG PO TABS: 5.0000 mg | ORAL_TABLET | Freq: Every evening | ORAL | Status: DC | PRN

## 2022-01-09 MED ORDER — HYDROMORPHONE HCL 1 MG/ML IJ SOLN
0.2500 mg | INTRAMUSCULAR | Status: DC | PRN
  Administered 2022-01-09 (×2): 0.5 mg via INTRAVENOUS

## 2022-01-09 MED ORDER — SUCCINYLCHOLINE CHLORIDE 200 MG/10ML IV SOSY
PREFILLED_SYRINGE | INTRAVENOUS | Status: DC | PRN
  Administered 2022-01-09: 130 mg via INTRAVENOUS

## 2022-01-09 MED ORDER — LIDOCAINE 2% (20 MG/ML) 5 ML SYRINGE
INTRAMUSCULAR | Status: AC
  Filled 2022-01-09: qty 5

## 2022-01-09 MED ORDER — KETAMINE HCL 10 MG/ML IJ SOLN
INTRAMUSCULAR | Status: DC | PRN
  Administered 2022-01-09: 20 mg via INTRAVENOUS
  Administered 2022-01-09: 10 mg via INTRAVENOUS
  Administered 2022-01-09: 20 mg via INTRAVENOUS

## 2022-01-09 MED ORDER — HYDROMORPHONE HCL 1 MG/ML IJ SOLN
INTRAMUSCULAR | Status: DC | PRN
  Administered 2022-01-09 (×2): .25 mg via INTRAVENOUS

## 2022-01-09 MED ORDER — DEXAMETHASONE SODIUM PHOSPHATE 10 MG/ML IJ SOLN
INTRAMUSCULAR | Status: DC | PRN
  Administered 2022-01-09: 5 mg via INTRAVENOUS

## 2022-01-09 MED ORDER — SODIUM CHLORIDE 0.9 % IV SOLN
5.0000 ug/kg/min | INTRAVENOUS | Status: DC
  Administered 2022-01-09: 5 ug/kg/min via INTRAVENOUS
  Filled 2022-01-09 (×2): qty 20

## 2022-01-09 MED ORDER — BUPIVACAINE-EPINEPHRINE (PF) 0.25% -1:200000 IJ SOLN
INTRAMUSCULAR | Status: AC
  Filled 2022-01-09: qty 30

## 2022-01-09 MED ORDER — ACETAMINOPHEN 160 MG/5ML PO SOLN: 1000.0000 mg | Freq: Once | ORAL | Status: DC | PRN

## 2022-01-09 MED ORDER — OXYCODONE HCL 5 MG PO TABS: 5.0000 mg | ORAL_TABLET | Freq: Once | ORAL | Status: DC | PRN

## 2022-01-09 MED ORDER — HYDROCODONE-ACETAMINOPHEN 5-325 MG PO TABS: 1.0000 | ORAL_TABLET | ORAL | Status: DC | PRN

## 2022-01-09 MED ORDER — ACETAMINOPHEN 500 MG PO TABS: 1000.0000 mg | ORAL_TABLET | Freq: Once | ORAL | Status: DC | PRN

## 2022-01-09 MED ORDER — CEFAZOLIN SODIUM-DEXTROSE 2-4 GM/100ML-% IV SOLN
2.0000 g | INTRAVENOUS | Status: AC
  Administered 2022-01-09: 2 g via INTRAVENOUS

## 2022-01-09 MED ORDER — ALPRAZOLAM 0.5 MG PO TABS: 0.2500 mg | ORAL_TABLET | Freq: Every day | ORAL | Status: DC | PRN

## 2022-01-09 MED ORDER — SODIUM CHLORIDE 0.9 % IV SOLN: 250.0000 mL | INTRAVENOUS | Status: DC

## 2022-01-09 MED ORDER — METHOCARBAMOL 500 MG PO TABS
500.0000 mg | ORAL_TABLET | Freq: Four times a day (QID) | ORAL | Status: DC | PRN
  Administered 2022-01-09: 500 mg via ORAL
  Administered 2022-01-09 – 2022-01-10 (×2): 1000 mg via ORAL
  Filled 2022-01-09: qty 2
  Filled 2022-01-09: qty 1
  Filled 2022-01-09: qty 2

## 2022-01-09 MED ORDER — PROPOFOL 10 MG/ML IV BOLUS
INTRAVENOUS | Status: AC
  Filled 2022-01-09: qty 20

## 2022-01-09 MED ORDER — OXYCODONE HCL 5 MG/5ML PO SOLN: 5.0000 mg | Freq: Once | ORAL | Status: DC | PRN

## 2022-01-09 MED ORDER — PROPOFOL 10 MG/ML IV BOLUS
INTRAVENOUS | Status: DC | PRN
  Administered 2022-01-09: 50 mg via INTRAVENOUS
  Administered 2022-01-09: 200 mg via INTRAVENOUS
  Administered 2022-01-09: 40 mg via INTRAVENOUS

## 2022-01-09 MED ORDER — ONDANSETRON HCL 4 MG/2ML IJ SOLN: 4.0000 mg | Freq: Four times a day (QID) | INTRAMUSCULAR | Status: DC | PRN

## 2022-01-09 MED ORDER — PROPOFOL 500 MG/50ML IV EMUL
INTRAVENOUS | Status: DC | PRN
  Administered 2022-01-09: 125 ug/kg/min via INTRAVENOUS

## 2022-01-09 MED ORDER — ONDANSETRON HCL 4 MG/2ML IJ SOLN
INTRAMUSCULAR | Status: DC | PRN
  Administered 2022-01-09: 4 mg via INTRAVENOUS

## 2022-01-09 MED ORDER — ADULT MULTIVITAMIN W/MINERALS CH: 1.0000 | ORAL_TABLET | Freq: Every morning | ORAL | Status: DC

## 2022-01-09 MED ORDER — HYDROMORPHONE HCL 1 MG/ML IJ SOLN
INTRAMUSCULAR | Status: AC
  Filled 2022-01-09: qty 1

## 2022-01-09 MED ORDER — METHOCARBAMOL 1000 MG/10ML IJ SOLN: 500.0000 mg | Freq: Four times a day (QID) | INTRAVENOUS | Status: DC | PRN

## 2022-01-09 MED ORDER — 0.9 % SODIUM CHLORIDE (POUR BTL) OPTIME
TOPICAL | Status: DC | PRN
  Administered 2022-01-09 (×2): 1000 mL

## 2022-01-09 MED ORDER — FENTANYL CITRATE (PF) 250 MCG/5ML IJ SOLN
INTRAMUSCULAR | Status: AC
  Filled 2022-01-09: qty 5

## 2022-01-09 MED ORDER — SUCCINYLCHOLINE CHLORIDE 200 MG/10ML IV SOSY
PREFILLED_SYRINGE | INTRAVENOUS | Status: AC
  Filled 2022-01-09: qty 20

## 2022-01-09 MED ORDER — CEFAZOLIN SODIUM-DEXTROSE 2-4 GM/100ML-% IV SOLN
2.0000 g | Freq: Three times a day (TID) | INTRAVENOUS | Status: AC
  Administered 2022-01-09 (×2): 2 g via INTRAVENOUS
  Filled 2022-01-09 (×2): qty 100

## 2022-01-09 MED ORDER — GLYCOPYRROLATE 0.2 MG/ML IJ SOLN
INTRAMUSCULAR | Status: DC | PRN
  Administered 2022-01-09: .1 mg via INTRAVENOUS

## 2022-01-09 MED ORDER — ROCURONIUM BROMIDE 10 MG/ML (PF) SYRINGE
PREFILLED_SYRINGE | INTRAVENOUS | Status: AC
  Filled 2022-01-09: qty 10

## 2022-01-09 MED ORDER — BISACODYL 5 MG PO TBEC: 5.0000 mg | DELAYED_RELEASE_TABLET | Freq: Every day | ORAL | Status: DC | PRN

## 2022-01-09 MED ORDER — MENTHOL 3 MG MT LOZG: 1.0000 | LOZENGE | OROMUCOSAL | Status: DC | PRN

## 2022-01-09 MED ORDER — LACTATED RINGERS IV SOLN: INTRAVENOUS | Status: DC

## 2022-01-09 MED ORDER — CHLORHEXIDINE GLUCONATE 0.12 % MT SOLN
OROMUCOSAL | Status: AC
  Administered 2022-01-09: 15 mL via OROMUCOSAL
  Filled 2022-01-09: qty 15

## 2022-01-09 MED ORDER — LACTATED RINGERS IV SOLN: INTRAVENOUS | Status: DC | PRN

## 2022-01-09 MED ORDER — KETAMINE HCL 50 MG/5ML IJ SOSY
PREFILLED_SYRINGE | INTRAMUSCULAR | Status: AC
  Filled 2022-01-09: qty 5

## 2022-01-09 MED ORDER — POVIDONE-IODINE 7.5 % EX SOLN: Freq: Once | CUTANEOUS | Status: DC

## 2022-01-09 MED ORDER — LIDOCAINE 2% (20 MG/ML) 5 ML SYRINGE
INTRAMUSCULAR | Status: DC | PRN
  Administered 2022-01-09: 60 mg via INTRAVENOUS

## 2022-01-09 MED ORDER — SODIUM CHLORIDE 0.9% FLUSH
3.0000 mL | Freq: Two times a day (BID) | INTRAVENOUS | Status: DC
  Administered 2022-01-09: 3 mL via INTRAVENOUS

## 2022-01-09 MED ORDER — CHLORHEXIDINE GLUCONATE 0.12 % MT SOLN: 15.0000 mL | Freq: Once | OROMUCOSAL | Status: AC

## 2022-01-09 MED ORDER — THROMBIN 20000 UNITS EX SOLR
CUTANEOUS | Status: AC
  Filled 2022-01-09: qty 20000

## 2022-01-09 MED ORDER — CEFAZOLIN SODIUM-DEXTROSE 2-4 GM/100ML-% IV SOLN
INTRAVENOUS | Status: AC
  Filled 2022-01-09: qty 100

## 2022-01-09 MED ORDER — ACETAMINOPHEN 10 MG/ML IV SOLN: 1000.0000 mg | Freq: Once | INTRAVENOUS | Status: DC | PRN

## 2022-01-09 MED ORDER — FENTANYL CITRATE (PF) 100 MCG/2ML IJ SOLN
INTRAMUSCULAR | Status: AC
  Administered 2022-01-09: 50 ug
  Filled 2022-01-09: qty 2

## 2022-01-09 MED ORDER — SENNOSIDES-DOCUSATE SODIUM 8.6-50 MG PO TABS: 1.0000 | ORAL_TABLET | Freq: Every evening | ORAL | Status: DC | PRN

## 2022-01-09 MED ORDER — FENTANYL CITRATE (PF) 250 MCG/5ML IJ SOLN
INTRAMUSCULAR | Status: DC | PRN
  Administered 2022-01-09 (×2): 100 ug via INTRAVENOUS
  Administered 2022-01-09: 50 ug via INTRAVENOUS

## 2022-01-09 MED ORDER — OXYCODONE-ACETAMINOPHEN 5-325 MG PO TABS
1.0000 | ORAL_TABLET | ORAL | Status: DC | PRN
  Administered 2022-01-09 – 2022-01-10 (×5): 2 via ORAL
  Filled 2022-01-09 (×5): qty 2

## 2022-01-09 MED ORDER — ALUM & MAG HYDROXIDE-SIMETH 200-200-20 MG/5ML PO SUSP: 30.0000 mL | Freq: Four times a day (QID) | ORAL | Status: DC | PRN

## 2022-01-09 MED ORDER — HYDROMORPHONE HCL 1 MG/ML IJ SOLN
INTRAMUSCULAR | Status: AC
  Filled 2022-01-09: qty 0.5

## 2022-01-09 MED ORDER — BUPIVACAINE-EPINEPHRINE (PF) 0.25% -1:200000 IJ SOLN
INTRAMUSCULAR | Status: DC | PRN
  Administered 2022-01-09: .01 mL
  Administered 2022-01-09: 40 mL

## 2022-01-09 MED ORDER — POTASSIUM CHLORIDE IN NACL 20-0.9 MEQ/L-% IV SOLN: INTRAVENOUS | Status: DC

## 2022-01-09 MED ORDER — BUPIVACAINE-EPINEPHRINE 0.25% -1:200000 IJ SOLN
INTRAMUSCULAR | Status: DC | PRN
  Administered 2022-01-09: 10 mL
  Administered 2022-01-09: .01 mL

## 2022-01-09 MED ORDER — FENTANYL CITRATE (PF) 100 MCG/2ML IJ SOLN
25.0000 ug | INTRAMUSCULAR | Status: DC | PRN
  Administered 2022-01-09 (×2): 50 ug via INTRAVENOUS

## 2022-01-09 MED ORDER — THROMBIN 20000 UNITS EX SOLR: CUTANEOUS | Status: DC | PRN

## 2022-01-09 MED ORDER — ORAL CARE MOUTH RINSE: 15.0000 mL | Freq: Once | OROMUCOSAL | Status: AC

## 2022-01-09 MED ORDER — ACETAMINOPHEN 650 MG RE SUPP: 650.0000 mg | RECTAL | Status: DC | PRN

## 2022-01-09 MED ORDER — MORPHINE SULFATE (PF) 2 MG/ML IV SOLN: 1.0000 mg | INTRAVENOUS | Status: DC | PRN

## 2022-01-09 MED ORDER — ACETAMINOPHEN 325 MG PO TABS: 650.0000 mg | ORAL_TABLET | ORAL | Status: DC | PRN

## 2022-01-09 MED ORDER — FENTANYL CITRATE (PF) 100 MCG/2ML IJ SOLN: 25.0000 ug | INTRAMUSCULAR | Status: DC | PRN

## 2022-01-09 MED ORDER — PHENYLEPHRINE HCL-NACL 20-0.9 MG/250ML-% IV SOLN
INTRAVENOUS | Status: DC | PRN
  Administered 2022-01-09: 40 ug/min via INTRAVENOUS

## 2022-01-09 MED ORDER — FLEET ENEMA 7-19 GM/118ML RE ENEM: 1.0000 | ENEMA | Freq: Once | RECTAL | Status: DC | PRN

## 2022-01-09 MED ORDER — SODIUM CHLORIDE 0.9% FLUSH: 3.0000 mL | INTRAVENOUS | Status: DC | PRN

## 2022-01-09 MED ORDER — PHENYLEPHRINE 80 MCG/ML (10ML) SYRINGE FOR IV PUSH (FOR BLOOD PRESSURE SUPPORT)
PREFILLED_SYRINGE | INTRAVENOUS | Status: AC
  Filled 2022-01-09: qty 20

## 2022-01-09 MED ORDER — MIDAZOLAM HCL 2 MG/2ML IJ SOLN
INTRAMUSCULAR | Status: DC | PRN
  Administered 2022-01-09: 2 mg via INTRAVENOUS

## 2022-01-09 SURGICAL SUPPLY — 109 items
BAG COUNTER SPONGE SURGICOUNT (BAG) ×4 IMPLANT
BENZOIN TINCTURE PRP APPL 2/3 (GAUZE/BANDAGES/DRESSINGS) ×2 IMPLANT
BLADE CLIPPER SURG (BLADE) IMPLANT
BLADE EXTENDER LTP N26 DISP (ORTHOPEDIC DISPOSABLE SUPPLIES) IMPLANT
BLADE SURG 10 STRL SS (BLADE) ×2 IMPLANT
BONE VIVIGEN FORMABLE 10CC (Bone Implant) ×2 IMPLANT
BUR PRESCISION 1.7 ELITE (BURR) ×2 IMPLANT
BUR ROUND FLUTED 5 RND (BURR) ×2 IMPLANT
BUR ROUND PRECISION 4.0 (BURR) IMPLANT
BUR SABER RD CUTTING 3.0 (BURR) IMPLANT
CLIP SPRING STIM LLIF SAFEOP (CLIP) IMPLANT
CNTNR URN SCR LID CUP LEK RST (MISCELLANEOUS) ×2 IMPLANT
CONT SPEC 4OZ STRL OR WHT (MISCELLANEOUS) ×4
COUNTER NEEDLE 20 DBL MAG RED (NEEDLE) IMPLANT
COVER BACK TABLE 80X110 HD (DRAPES) ×2 IMPLANT
COVER MAYO STAND STRL (DRAPES) ×4 IMPLANT
COVER SURGICAL LIGHT HANDLE (MISCELLANEOUS) ×4 IMPLANT
DILATOR INSULATED SAFEOP OVAL (NEUROSURGERY SUPPLIES) IMPLANT
DRAIN CHANNEL 15F RND FF W/TCR (WOUND CARE) IMPLANT
DRAPE C-ARM 35X43 STRL (DRAPES) IMPLANT
DRAPE C-ARM 42X72 X-RAY (DRAPES) ×4 IMPLANT
DRAPE C-ARMOR (DRAPES) ×2 IMPLANT
DRAPE POUCH INSTRU U-SHP 10X18 (DRAPES) ×4 IMPLANT
DRAPE SURG 17X23 STRL (DRAPES) ×16 IMPLANT
DURAPREP 26ML APPLICATOR (WOUND CARE) ×4 IMPLANT
ELECT BLADE 4.0 EZ CLEAN MEGAD (MISCELLANEOUS) ×2
ELECT BLADE 6.5 EXT (BLADE) ×2 IMPLANT
ELECT CAUTERY BLADE 6.4 (BLADE) ×4 IMPLANT
ELECT KIT SAFEOP SSEP/SURF (KITS) ×2
ELECT REM PT RETURN 9FT ADLT (ELECTROSURGICAL) ×4
ELECTRODE BLDE 4.0 EZ CLN MEGD (MISCELLANEOUS) ×2 IMPLANT
ELECTRODE KT SAFEOP SSEP/SURF (KITS) IMPLANT
ELECTRODE REM PT RTRN 9FT ADLT (ELECTROSURGICAL) ×4 IMPLANT
EVACUATOR SILICONE 100CC (DRAIN) IMPLANT
FILTER STRAW FLUID ASPIR (MISCELLANEOUS) ×2 IMPLANT
GAUZE 4X4 16PLY ~~LOC~~+RFID DBL (SPONGE) ×4 IMPLANT
GAUZE SPONGE 4X4 12PLY STRL (GAUZE/BANDAGES/DRESSINGS) ×2 IMPLANT
GAUZE SPONGE 4X4 12PLY STRL LF (GAUZE/BANDAGES/DRESSINGS) IMPLANT
GLOVE BIO SURGEON STRL SZ7 (GLOVE) ×6 IMPLANT
GLOVE BIO SURGEON STRL SZ8 (GLOVE) ×4 IMPLANT
GLOVE BIOGEL PI IND STRL 7.0 (GLOVE) ×4 IMPLANT
GLOVE BIOGEL PI IND STRL 8 (GLOVE) ×4 IMPLANT
GLOVE SURG ENC MOIS LTX SZ6.5 (GLOVE) ×6 IMPLANT
GOWN STRL REUS W/ TWL LRG LVL3 (GOWN DISPOSABLE) ×6 IMPLANT
GOWN STRL REUS W/ TWL XL LVL3 (GOWN DISPOSABLE) ×6 IMPLANT
GOWN STRL REUS W/TWL LRG LVL3 (GOWN DISPOSABLE) ×6
GOWN STRL REUS W/TWL XL LVL3 (GOWN DISPOSABLE) ×6
GRAFT BNE MATRIX VG FRMBL L 10 (Bone Implant) IMPLANT
GUIDEWIRE SHARP VIPER II (WIRE) IMPLANT
GUIDEWIRE TROCAR TIP 320 (WIRE) IMPLANT
IV CATH 14GX2 1/4 (CATHETERS) ×4 IMPLANT
KIT ALARA NEURO ACCESS (KITS) IMPLANT
KIT BASIN OR (CUSTOM PROCEDURE TRAY) ×4 IMPLANT
KIT POSITION SURG JACKSON T1 (MISCELLANEOUS) ×2 IMPLANT
KIT TURNOVER KIT B (KITS) ×4 IMPLANT
KNIFE ANNULOTOMY (BLADE) IMPLANT
LIF ILLUMINATION SYSTEM STERIL (SYSTAGENIX WOUND MANAGEMENT) ×2
MARKER SKIN DUAL TIP RULER LAB (MISCELLANEOUS) ×6 IMPLANT
NDL 18GX1X1/2 (RX/OR ONLY) (NEEDLE) ×2 IMPLANT
NDL 22X1.5 STRL (OR ONLY) (MISCELLANEOUS) ×4 IMPLANT
NDL HYPO 25GX1X1/2 BEV (NEEDLE) ×4 IMPLANT
NDL SPNL 18GX3.5 QUINCKE PK (NEEDLE) ×6 IMPLANT
NEEDLE 18GX1X1/2 (RX/OR ONLY) (NEEDLE) ×2 IMPLANT
NEEDLE 22X1.5 STRL (OR ONLY) (MISCELLANEOUS) ×4 IMPLANT
NEEDLE HYPO 25GX1X1/2 BEV (NEEDLE) ×4 IMPLANT
NEEDLE SPNL 18GX3.5 QUINCKE PK (NEEDLE) ×6 IMPLANT
NS IRRIG 1000ML POUR BTL (IV SOLUTION) ×6 IMPLANT
PACK LAMINECTOMY ORTHO (CUSTOM PROCEDURE TRAY) ×4 IMPLANT
PACK UNIVERSAL I (CUSTOM PROCEDURE TRAY) ×4 IMPLANT
PAD ARMBOARD 7.5X6 YLW CONV (MISCELLANEOUS) ×8 IMPLANT
PATTIES SURGICAL .5 X1 (DISPOSABLE) ×2 IMPLANT
PATTIES SURGICAL .5X1.5 (GAUZE/BANDAGES/DRESSINGS) ×2 IMPLANT
PENCIL BUTTON HOLSTER BLD 10FT (ELECTRODE) IMPLANT
PLATE LIF AMP 2/SCRW 04/15 (Plate) IMPLANT
PROBE BALL TIP LLIF SAFEOP (NEUROSURGERY SUPPLIES) IMPLANT
ROD VIPER2 5.5X45 PRE LARDOSED (Rod) IMPLANT
SCREW LIF AMP 5.5X55 (Screw) IMPLANT
SCREW SET SINGLE INNER MIS (Screw) IMPLANT
SCREW VIPER II XTAB POLY 7X45M (Screw) IMPLANT
SCREW XTAB POLY VIPER  7X45 (Screw) ×2 IMPLANT
SCREW XTAB POLY VIPER 7X45 (Screw) IMPLANT
SHIM INTRADISCAL LTP N 28 DISP (ORTHOPEDIC DISPOSABLE SUPPLIES) IMPLANT
SPACER TRANSCEND 8X22X60 0D (Spacer) IMPLANT
SPONGE INTESTINAL PEANUT (DISPOSABLE) ×6 IMPLANT
SPONGE SURGIFOAM ABS GEL 100 (HEMOSTASIS) ×2 IMPLANT
SPONGE T-LAP 4X18 ~~LOC~~+RFID (SPONGE) ×2 IMPLANT
STAPLER VISISTAT 35W (STAPLE) ×2 IMPLANT
STRIP CLOSURE SKIN 1/2X4 (GAUZE/BANDAGES/DRESSINGS) ×4 IMPLANT
SURGIFLO W/THROMBIN 8M KIT (HEMOSTASIS) IMPLANT
SUT MNCRL AB 4-0 PS2 18 (SUTURE) ×4 IMPLANT
SUT VIC AB 0 CT1 18XCR BRD 8 (SUTURE) ×4 IMPLANT
SUT VIC AB 0 CT1 8-18 (SUTURE) ×4
SUT VIC AB 0 CTX 18 (SUTURE) IMPLANT
SUT VIC AB 1 CT1 18XCR BRD 8 (SUTURE) ×4 IMPLANT
SUT VIC AB 1 CT1 8-18 (SUTURE) ×4
SUT VIC AB 2-0 CT2 18 VCP726D (SUTURE) ×4 IMPLANT
SYR 10ML LL (SYRINGE) IMPLANT
SYR 20ML LL LF (SYRINGE) ×4 IMPLANT
SYR BULB IRRIG 60ML STRL (SYRINGE) ×4 IMPLANT
SYR CONTROL 10ML LL (SYRINGE) ×6 IMPLANT
SYR TB 1ML LUER SLIP (SYRINGE) ×2 IMPLANT
SYSTEM ILLUMINATION LIF STERIL (SYSTAGENIX WOUND MANAGEMENT) IMPLANT
TAP CANN VIPER2 DL 6.0 (TAP) IMPLANT
TAPE CLOTH SURG 4X10 WHT LF (GAUZE/BANDAGES/DRESSINGS) IMPLANT
TOWEL GREEN STERILE (TOWEL DISPOSABLE) ×2 IMPLANT
TOWEL GREEN STERILE FF (TOWEL DISPOSABLE) ×2 IMPLANT
TRAY FOLEY MTR SLVR 16FR STAT (SET/KITS/TRAYS/PACK) ×4 IMPLANT
WATER STERILE IRR 1000ML POUR (IV SOLUTION) ×4 IMPLANT
YANKAUER SUCT BULB TIP NO VENT (SUCTIONS) ×4 IMPLANT

## 2022-01-09 NOTE — Anesthesia Procedure Notes (Addendum)
Procedure Name: Intubation Date/Time: 01/09/2022 8:44 AM  Performed by: Elvin So, CRNAPre-anesthesia Checklist: Patient identified, Emergency Drugs available, Suction available and Patient being monitored Patient Re-evaluated:Patient Re-evaluated prior to induction Oxygen Delivery Method: Circle system utilized Preoxygenation: Pre-oxygenation with 100% oxygen Induction Type: IV induction Ventilation: Mask ventilation without difficulty Laryngoscope Size: Mac and 4 Grade View: Grade I Tube type: Oral Tube size: 7.5 mm Number of attempts: 1 Airway Equipment and Method: Stylet and Oral airway Placement Confirmation: ETT inserted through vocal cords under direct vision, positive ETCO2 and breath sounds checked- equal and bilateral Secured at: 22 cm Tube secured with: Tape Dental Injury: Teeth and Oropharynx as per pre-operative assessment

## 2022-01-09 NOTE — Anesthesia Preprocedure Evaluation (Signed)
Anesthesia Evaluation  Patient identified by MRN, date of birth, ID band Patient awake    Reviewed: Allergy & Precautions, H&P , NPO status , Patient's Chart, lab work & pertinent test results  History of Anesthesia Complications Negative for: history of anesthetic complications  Airway Mallampati: I  TM Distance: >3 FB Neck ROM: Full    Dental  (+) Dental Advisory Given, Teeth Intact,    Pulmonary neg pulmonary ROS, former smoker   breath sounds clear to auscultation       Cardiovascular negative cardio ROS  Rhythm:Regular     Neuro/Psych  Headaches  Neuromuscular disease  negative psych ROS   GI/Hepatic negative GI ROS, Neg liver ROS,,,  Endo/Other  negative endocrine ROS    Renal/GU negative Renal ROS     Musculoskeletal negative musculoskeletal ROS (+)    Abdominal   Peds  Hematology negative hematology ROS (+)   Anesthesia Other Findings   Reproductive/Obstetrics                             Anesthesia Physical Anesthesia Plan  ASA: 1  Anesthesia Plan: General   Post-op Pain Management: Ofirmev IV (intra-op)*, Toradol IV (intra-op)* and Ketamine IV*   Induction: Intravenous  PONV Risk Score and Plan: 2 and Ondansetron and Dexamethasone  Airway Management Planned: Oral ETT  Additional Equipment: None  Intra-op Plan:   Post-operative Plan: Extubation in OR  Informed Consent: I have reviewed the patients History and Physical, chart, labs and discussed the procedure including the risks, benefits and alternatives for the proposed anesthesia with the patient or authorized representative who has indicated his/her understanding and acceptance.     Dental advisory given  Plan Discussed with: CRNA  Anesthesia Plan Comments:        Anesthesia Quick Evaluation

## 2022-01-09 NOTE — Op Note (Signed)
NAME: Zachary Mueller   MEDICAL RECORD NO: [id number]   DATE OF BIRTH: 05/06/1965   DATE OF PROCEDURE:  01/09/2022    OPERATIVE REPORT     PREOPERATIVE DIAGNOSES: 1.  Left-sided L4 radiculopathy. 2.  Left-sided L4/5 segmental collapse 3,  L4/5 spinal stenosis   POSTOPERATIVE DIAGNOSES: 1.  Left-sided L4 radiculopathy. 2.  Left-sided L4/5 segmental collapse 3,  L4/5 spinal stenosis   PROCEDURE:    1.  Right-sided L4/5 lateral interbody fusion via a direct lateral retroperitoneal approach. 2.  Insertion of interbody device x1 (8 mm x 22 mm x 60 mm Alphatec intervertebral spacer). 3.  Placement of anterior instrumentation, L4-5 (Alphatec anterior plate and 35KK screws) 4.  Use of morselized allograft -- ViviGen.  5.  Posterior spinal fusion, L4/5 6.  Placement of posterior instrumentation, L4/5  7.  Intraoperative use of fluoroscopy.   SURGEON:  Phylliss Bob, MD   ASSISTANT:  Pricilla Holm PA-C.   ANESTHESIA:  General endotracheal anesthesia.   COMPLICATIONS:  None.   DISPOSITION:  Stable.   ESTIMATED BLOOD LOSS:  Minimal.   INDICATIONS:  Briefly, Mr. Behrend is a very pleasant 56 year-old male who did present to me with ongoing pain and weakness in the left leg. MRI and Xrays were as noted above. Given the patient's ongoing symptoms, and lack of improvement with appropriate conservative treatment measures, I did discuss proceeding with the procedure reflected above. The patient was fully aware of the risks and limitations of surgery, and did wish to proceed.   DESCRIPTION OF PROCEDURE:  On 01/09/2022, the patient was brought to surgery and general endotracheal anesthesia was administered.  The patient was placed in the lateral decubitus position, with the right side up.  Neurologic monitoring leads were placed by the monitoring technician.  The patient's torso and lower extremities were secured to the bed.  The patient's hips and knees were flexed in order to lessen the  tension on the psoas musculature.  The right flank was then prepped and draped in the usual sterile fashion.  The bed was slightly flexed, in order to optimize exposure to the L4-5 intervertebral space. After a timeout procedure was performed, a right-sided transverse incision was made over the right flank overlying the L4-5 intervertebral space.  I did dissect just above the iliac crest, and through the external and internal oblique musculature, and transversalis musculature and fascia.  The retroperitoneal space was encountered.  The peritoneum was bluntly swept anteriorly, and the psoas was readily identified.  I did use a series of dilators to dock over the L4-5 intervertebral space.  I did use neurologic monitoring while placing the dilators, in order to ensure that there were no neurologic structures in the immediate vicinity of the dilators.  The lumbar plexus was noted to be posterior.  A self-retaining retractor was placed, and was attached to a rigid arm.  The retractor was very gently dilated and a shim was placed into the L4/5 intertebral space.  I then used a knife to perform an annulotomy at the lateral aspect of the L4-5 intervertebral space.  I then used a series of curettes and pituitary rongeurs in order to perform a thorough and complete L4-5 intervertebral diskectomy.  The contralateral annulus was released.  I then placed a series of intervertebral spacer trials, and I did feel that a 8 x 22 x 60 mm spacer would be the most appropriate fit. The appropriate spacer was then packed with ViviGen and tamped into position.  I  was very pleased with the final resting position of the intervertebral spacer.  At this point, an anterior plate was placed over the lateral aspect of the L4 and L5 vertebral bodies.  I then used an awl to prepare the trajectory of the L4 and L5 vertebral body screws.  A 55 mm screw was placed into the L4 vertebral body, and a 55 mm screw was placed into the L5 vertebral body.   The screws were then locked into the plate.  The break in the bed was removed and the bed was flattened and the plate was then locked.  I was very pleased with the final AP and lateral fluoroscopic images, as excellent restoration of disk height identified on both AP and lateral images was noted. At this point, the wound was copiously irrigated.  The fascia, internal, and external oblique musculature was closed using #1 Vicryl.  The subcutaneous layer was closed using 2-0 Vicryl and the skin was closed using 4-0 Monocryl.  Benzoin and Steri-Strips were applied followed by sterile dressing.    At this point, I marked out the L4 and L5 pedicles on the right, using AP and lateral fluoroscopy.  A right-sided paramedian incision was made, just lateral to the L4 and L5 pedicles.  The posteriolateral gutter was exposed and decorticated using a high-speed bur.  The remainder of the Vivigen was then packed into the posteriolateral gutter.  I then cannulated the right L4 and L5 pedicles using Jamshidi needles, after which point guidewires were placed.  7 mm screws of the appropriate length were then advanced over the guidewires into the right L4 and L5 pedicles.  A 45 mm rod was secured into the tulip heads of the screws.  Caps were then placed in a final locking procedure was performed.  I was very pleased with the final AP and lateral fluoroscopic images. The subcutaneous layer was then closed using 2-0 Vicryl and the skin was closed using 4-0 Monocryl.  Benzoin and Steri-Strips were applied followed by sterile dressing.      Of note, I  did use neurologic monitoring throughout the entire surgery, and there was no sustained EMG activity noted throughout the entire surgery. All instrument counts were correct at the termination of the procedure.   Of note, Jason Coop was my assistant throughout surgery, and did aid in retraction, suctioning, placement of the hardware, and closure.  Estill Bamberg, MD

## 2022-01-09 NOTE — Transfer of Care (Signed)
Immediate Anesthesia Transfer of Care Note  Patient: Zachary Mueller  Procedure(s) Performed: RIGHT-SIDED LUMBAR 4- LUMBAR 5 LATERAL INTERBODY FUSION WITH INSTRUMENTATION AND ALLOGRAFT (Right) LUMBAR 4 - LUMBAR 5 POSTERIOR SPINAL FUSION WITH INSTRUMENTATION AND ALLOGRAFT  Patient Location: PACU  Anesthesia Type:General  Level of Consciousness: awake  Airway & Oxygen Therapy: Patient Spontanous Breathing  Post-op Assessment: Report given to RN  Post vital signs: Reviewed  Last Vitals:  Vitals Value Taken Time  BP 112/84 01/09/22 1138  Temp    Pulse 85 01/09/22 1140  Resp 12 01/09/22 1141  SpO2 95 % 01/09/22 1140  Vitals shown include unvalidated device data.  Last Pain:  Vitals:   01/09/22 0709  TempSrc: Oral  PainSc:       Patients Stated Pain Goal: 0 (79/48/01 6553)  Complications: No notable events documented.

## 2022-01-09 NOTE — H&P (Signed)
PREOPERATIVE H&P  Chief Complaint: Left leg pain  HPI: Zachary Mueller is a 56 y.o. male who presents with ongoing pain in the left leg  MRI reveals stenosis and segmental collapse at L4/5  Patient has failed multiple forms of conservative care and continues to have pain (see office notes for additional details regarding the patient's full course of treatment)  Past Medical History:  Diagnosis Date   Headache    Radiculopathy of lumbar region    Past Surgical History:  Procedure Laterality Date   BACK SURGERY  08/2016   HAND TENDON SURGERY Right    Social History   Socioeconomic History   Marital status: Married    Spouse name: Not on file   Number of children: 3   Years of education: 14   Highest education level: Associate degree: academic program  Occupational History   Occupation: Charity fundraiser  Tobacco Use   Smoking status: Former    Types: Cigarettes    Quit date: 2021    Years since quitting: 2.7   Smokeless tobacco: Never  Vaping Use   Vaping Use: Never used  Substance and Sexual Activity   Alcohol use: Yes    Comment: 2 beers per day   Drug use: No   Sexual activity: Not on file  Other Topics Concern   Not on file  Social History Narrative   Lives at home with his wife.   Right-handed.   4-5 cups of caffeine per day.   Social Determinants of Health   Financial Resource Strain: Not on file  Food Insecurity: Not on file  Transportation Needs: Not on file  Physical Activity: Not on file  Stress: Not on file  Social Connections: Not on file   Family History  Problem Relation Age of Onset   Healthy Mother    Healthy Father    Allergies  Allergen Reactions   Aleve [Naproxen] Anaphylaxis and Hives   Prior to Admission medications   Medication Sig Start Date End Date Taking? Authorizing Provider  cyclobenzaprine (FLEXERIL) 5 MG tablet Take 5 mg by mouth daily as needed for muscle spasms.   Yes [provider]  ibuprofen  (ADVIL) 200 MG tablet Take 200-400 mg by mouth every 8 (eight) hours as needed for moderate pain.   Yes [provider]  meloxicam (MOBIC) 15 MG tablet Take 15 mg by mouth daily. 09/11/21  Yes [provider]  Multiple Vitamin (MULTIVITAMIN WITH MINERALS) TABS tablet Take 1 tablet by mouth in the morning.   Yes [provider]  ALPRAZolam (XANAX) 0.25 MG tablet Take 0.25 mg by mouth daily as needed for anxiety.    [provider]     All other systems have been reviewed and were otherwise negative with the exception of those mentioned in the HPI and as above.  Physical Exam: Vitals:   01/09/22 0709  BP: (!) 133/93  Pulse: 81  Resp: 17  Temp: 98.1 F (36.7 C)  SpO2: 98%    Body mass index is 23.48 kg/m.  General: Alert, no acute distress Cardiovascular: No pedal edema Respiratory: No cyanosis, no use of accessory musculature Skin: No lesions in the area of chief complaint Neurologic: Sensation intact distally Psychiatric: Patient is competent for consent with normal mood and affect Lymphatic: No axillary or cervical lymphadenopathy   Assessment/Plan: Ongoing prominent left L4 radiculopathy symptomatology for greater than one year with failure conservative care Plan for Procedure(s): RIGHT-SIDED LUMBAR 4- LUMBAR 5 LATERAL INTERBODY  FUSION WITH INSTRUMENTATION AND ALLOGRAFT LUMBAR 4 - LUMBAR 5 POSTERIOR SPINAL FUSION WITH INSTRUMENTATION AND ALLOGRAFT   Jackelyn Hoehn, MD 01/09/2022 7:55 AM

## 2022-01-10 MED ORDER — METHOCARBAMOL 500 MG PO TABS: 500.0000 mg | ORAL_TABLET | Freq: Four times a day (QID) | ORAL | 2 refills | Status: DC | PRN

## 2022-01-10 MED ORDER — OXYCODONE-ACETAMINOPHEN 5-325 MG PO TABS: 1.0000 | ORAL_TABLET | ORAL | 0 refills | Status: DC | PRN

## 2022-01-10 NOTE — Anesthesia Postprocedure Evaluation (Signed)
Anesthesia Post Note  Patient: Zachary Mueller  Procedure(s) Performed: RIGHT-SIDED LUMBAR 4- LUMBAR 5 LATERAL INTERBODY FUSION WITH INSTRUMENTATION AND ALLOGRAFT (Right) LUMBAR 4 - LUMBAR 5 POSTERIOR SPINAL FUSION WITH INSTRUMENTATION AND ALLOGRAFT     Patient location during evaluation: PACU Anesthesia Type: General Level of consciousness: awake and alert Pain management: pain level controlled Vital Signs Assessment: post-procedure vital signs reviewed and stable Respiratory status: spontaneous breathing, nonlabored ventilation and respiratory function stable Cardiovascular status: blood pressure returned to baseline and stable Postop Assessment: no apparent nausea or vomiting Anesthetic complications: no   No notable events documented.  Last Vitals:  Vitals:   01/10/22 0348 01/10/22 0741  BP: 119/85 118/81  Pulse: 74 80  Resp: 15 17  Temp: 36.9 C 36.7 C  SpO2: 100% 99%    Last Pain:  Vitals:   01/10/22 0741  TempSrc: Oral  PainSc:                  Spyridon Hornstein

## 2022-01-10 NOTE — Progress Notes (Signed)
    Patient doing well    Physical Exam: Vitals:   01/09/22 2329 01/10/22 0348  BP: 106/71 119/85  Pulse: 74 74  Resp: 16 15  Temp: 98.5 F (36.9 C) 98.4 F (36.9 C)  SpO2: 96% 100%    Dressing in place NVI  POD #1 s/p L4/5 lateral/posterior fusion, doing well  - up with PT/OT, encourage ambulation - Percocet for pain, Robaxin for muscle spasms - likely d/c home today with f/u in 2 weeks

## 2022-01-10 NOTE — Plan of Care (Signed)
Pt doing well. Pt and wife given D/C instructions with verbal understanding. Rx's were sent to the pharmacy by MD. Pt's incision is clean and dry with no sign of infection. Pt's IV was removed prior to D/C. Pt is voiding and ambulating independently. Pt D/C'd home via wheelchair per MD order. Pt is stable @ D/C and has no other needs at this time. Holli Humbles, RN

## 2022-01-10 NOTE — Evaluation (Signed)
Physical Therapy Evaluation and Discharge  Patient Details Name: Zachary Mueller MRN: [id number] DOB: Jul 29, 1965 Today's Date: 01/10/2022  History of Present Illness  Pt is a 56 year old man admitted on 01/09/22 for R side lateral and posterior ILF L4-5. PMH significant for prior back surgery in 2018.   Clinical Impression  Patient evaluated by Physical Therapy with no further acute PT needs identified. All education has been completed and the patient has no further questions. Pt was able to demonstrate transfers and ambulation with gross modified independence and no AD. Brace size was adjusted for optimal fit. Pt was educated on precautions, brace application/wearing schedule, appropriate activity progression, and car transfer. See below for any follow-up Physical Therapy or equipment needs. PT is signing off. Thank you for this referral.        Recommendations for follow up therapy are one component of a multi-disciplinary discharge planning process, led by the attending physician.  Recommendations may be updated based on patient status, additional functional criteria and insurance authorization.  Follow Up Recommendations No PT follow up      Assistance Recommended at Discharge PRN  Patient can return home with the following  Assist for transportation;Assistance with cooking/housework    Equipment Recommendations None recommended by PT  Recommendations for Other Services       Functional Status Assessment Patient has had a recent decline in their functional status and demonstrates the ability to make significant improvements in function in a reasonable and predictable amount of time.     Precautions / Restrictions Precautions Precautions: Back Precaution Booklet Issued: Yes (comment) Precaution Comments: Reviewed handout and pt was cued for precautions during functional mobility. Required Braces or Orthoses: Spinal Brace Spinal Brace: Lumbar corset;Applied in sitting  position Restrictions Weight Bearing Restrictions: No      Mobility  Bed Mobility               General bed mobility comments: Pt was received up in room with OT present    Transfers Overall transfer level: Independent Equipment used: None               General transfer comment: Pt was able to maintain good posture with sit<>stand and min use of UE support.    Ambulation/Gait Ambulation/Gait assistance: Modified independent (Device/Increase time) Gait Distance (Feet): 400 Feet Assistive device: None Gait Pattern/deviations: Step-through pattern, Decreased stride length, Trunk flexed Gait velocity: Decreased Gait velocity interpretation: 1.31 - 2.62 ft/sec, indicative of limited community ambulator   General Gait Details: Pt was able to ambulate in hallway without assistance. No unsteadiness or LOB noted.  Stairs Stairs: Yes Stairs assistance: Modified independent (Device/Increase time) Stair Management: One rail Left, Step to pattern, Forwards Number of Stairs: 10 General stair comments: VC's for sequencing and general safety.  Wheelchair Mobility    Modified Rankin (Stroke Patients Only)       Balance Overall balance assessment: Mild deficits observed, not formally tested                                           Pertinent Vitals/Pain Pain Assessment Pain Assessment: Faces Faces Pain Scale: Hurts a little bit Pain Location: R side Pain Descriptors / Indicators: Discomfort Pain Intervention(s): Limited activity within patient's tolerance, Monitored during session, Repositioned    Home Living Family/patient expects to be discharged to:: Private residence Living Arrangements: Spouse/significant other Available Help at  Discharge: Family;Available 24 hours/day (wife is working from home initially) Type of Home: House Home Access: Stairs to enter Entrance Stairs-Rails: Left Entrance Stairs-Number of Steps: 14   Home Layout: One  level Home Equipment: None      Prior Function Prior Level of Function : Independent/Modified Independent;Driving;Working/employed                     Hand Dominance   Dominant Hand: Right    Extremity/Trunk Assessment   Upper Extremity Assessment Upper Extremity Assessment: Defer to OT evaluation    Lower Extremity Assessment Lower Extremity Assessment: Generalized weakness (Mild; consistent with pre-op diagnosis)    Cervical / Trunk Assessment Cervical / Trunk Assessment: Back Surgery  Communication   Communication: No difficulties  Cognition Arousal/Alertness: Awake/alert Behavior During Therapy: WFL for tasks assessed/performed Overall Cognitive Status: Within Functional Limits for tasks assessed                                          General Comments      Exercises     Assessment/Plan    PT Assessment Patient does not need any further PT services  PT Problem List         PT Treatment Interventions      PT Goals (Current goals can be found in the Care Plan section)  Acute Rehab PT Goals Patient Stated Goal: Home today, back to being active PT Goal Formulation: All assessment and education complete, DC therapy    Frequency       Co-evaluation               AM-PAC PT "6 Clicks" Mobility  Outcome Measure Help needed turning from your back to your side while in a flat bed without using bedrails?: None Help needed moving from lying on your back to sitting on the side of a flat bed without using bedrails?: None Help needed moving to and from a bed to a chair (including a wheelchair)?: None Help needed standing up from a chair using your arms (e.g., wheelchair or bedside chair)?: None Help needed to walk in hospital room?: None Help needed climbing 3-5 steps with a railing? : None 6 Click Score: 24    End of Session Equipment Utilized During Treatment: Gait belt;Back brace Activity Tolerance: Patient tolerated  treatment well Patient left: in chair;with call bell/phone within reach Nurse Communication: Mobility status PT Visit Diagnosis: Unsteadiness on feet (R26.81);Pain Pain - part of body:  (Incision sites)    Time: 1740-8144 PT Time Calculation (min) (ACUTE ONLY): 17 min   Charges:   PT Evaluation $PT Eval Low Complexity: 1 Low          Conni Slipper, PT, DPT Acute Rehabilitation Services Secure Chat Preferred Office: [phone]   Marylynn Pearson 01/10/2022, 11:46 AM

## 2022-01-10 NOTE — Evaluation (Signed)
Occupational Therapy Evaluation Patient Details Name: Zachary Mueller MRN: [id number] DOB: 07-Sep-1965 Today's Date: 01/10/2022   History of Present Illness Pt is a 56 year old man admitted on 01/09/22 for R side lateral and posterior ILF L4-5.   Clinical Impression   Pt is functioning modified independently in ADLs and ADL transfers. All education completed with pt verbalizing understanding. No further OT needs.      Recommendations for follow up therapy are one component of a multi-disciplinary discharge planning process, led by the attending physician.  Recommendations may be updated based on patient status, additional functional criteria and insurance authorization.   Follow Up Recommendations  No OT follow up    Assistance Recommended at Discharge PRN  Patient can return home with the following Assistance with cooking/housework;Assist for transportation;Help with stairs or ramp for entrance    Functional Status Assessment  Patient has had a recent decline in their functional status and demonstrates the ability to make significant improvements in function in a reasonable and predictable amount of time.  Equipment Recommendations  None recommended by OT    Recommendations for Other Services       Precautions / Restrictions Precautions Precautions: Back Precaution Booklet Issued: Yes (comment) Required Braces or Orthoses: Spinal Brace Spinal Brace: Lumbar corset;Applied in sitting position      Mobility Bed Mobility Overal bed mobility: Modified Independent             General bed mobility comments: used log roll method, HOB flat    Transfers Overall transfer level: Independent Equipment used: None                      Balance                                           ADL either performed or assessed with clinical judgement   ADL Overall ADL's : Modified independent                                       General  ADL Comments: Educated in compensatory strategies for ADLs and IADLs to avoid.     Vision Baseline Vision/History: 0 No visual deficits       Perception     Praxis      Pertinent Vitals/Pain Pain Assessment Pain Assessment: Faces Faces Pain Scale: Hurts a little bit Pain Location: R side Pain Descriptors / Indicators: Discomfort Pain Intervention(s): Monitored during session, Repositioned     Hand Dominance Right   Extremity/Trunk Assessment Upper Extremity Assessment Upper Extremity Assessment: Overall WFL for tasks assessed   Lower Extremity Assessment Lower Extremity Assessment: Defer to PT evaluation   Cervical / Trunk Assessment Cervical / Trunk Assessment: Back Surgery   Communication Communication Communication: No difficulties   Cognition Arousal/Alertness: Awake/alert Behavior During Therapy: WFL for tasks assessed/performed Overall Cognitive Status: Within Functional Limits for tasks assessed                                       General Comments       Exercises     Shoulder Instructions      Home Living Family/patient expects to be discharged to:: Private residence Living  Arrangements: Spouse/significant other Available Help at Discharge: Family;Available 24 hours/day (wife is working from home initially) Type of Home: House Home Access: Stairs to enter Technical brewer of Steps: [place]: Two level     Bathroom Shower/Tub: Occupational psychologist: Handicapped height     [place]: None          Prior Functioning/Environment Prior Level of Function : Independent/Modified Independent;Driving;Working/employed                        OT Problem List:        OT Treatment/Interventions:      OT Goals(Current goals can be found in the care plan section)    OT Frequency:      Co-evaluation              AM-PAC OT "6 Clicks" Daily Activity     Outcome Measure Help from  another person eating meals?: None Help from another person taking care of personal grooming?: None Help from another person toileting, which includes using toliet, bedpan, or urinal?: None Help from another person bathing (including washing, rinsing, drying)?: None Help from another person to put on and taking off regular upper body clothing?: None Help from another person to put on and taking off regular lower body clothing?: None 6 Click Score: 24   End of Session Equipment Utilized During Treatment: Back brace  Activity Tolerance: Patient tolerated treatment well Patient left: Other (comment) (walking with PT)  OT Visit Diagnosis: Pain                Time: 2536-6440 OT Time Calculation (min): 12 min Charges:  OT General Charges $OT Visit: 1 Visit OT Evaluation $OT Eval Low Complexity: [place], OTR/L Acute Rehabilitation Services Office: [phone]   Malka So 01/10/2022, 9:07 AM

## 2022-01-11 MED FILL — Thrombin For Soln 20000 Unit: CUTANEOUS | Qty: 1 | Status: AC

## 2022-01-21 ENCOUNTER — Encounter (HOSPITAL_COMMUNITY): Payer: Self-pay | Admitting: Orthopedic Surgery

## 2022-01-22 ENCOUNTER — Encounter (HOSPITAL_COMMUNITY): Payer: Self-pay | Admitting: Orthopedic Surgery

## 2022-01-24 NOTE — Discharge Summary (Signed)
Patient ID: Zachary Mueller MRN: [id number] DOB/AGE: 57-Jan-1967 57 y.o.  Admit date: 01/09/2022 Discharge date: 01/10/2022  Admission Diagnoses:  Principal Problem:   Radiculopathy   Discharge Diagnoses:  Same  Past Medical History:  Diagnosis Date   Headache    Radiculopathy of lumbar region     Surgeries: Procedure(s): RIGHT-SIDED LUMBAR 4- LUMBAR 5 LATERAL INTERBODY FUSION WITH INSTRUMENTATION AND ALLOGRAFT LUMBAR 4 - LUMBAR 5 POSTERIOR SPINAL FUSION WITH INSTRUMENTATION AND ALLOGRAFT on 01/09/2022   Consultants: None  Discharged Condition: Improved  Hospital Course: Zachary Mueller is an 56 y.o. male who was admitted 01/09/2022 for operative treatment of Radiculopathy. Patient has severe unremitting pain that affects sleep, daily activities, and work/hobbies. After pre-op clearance the patient was taken to the operating room on 01/09/2022 and underwent  Procedure(s): RIGHT-SIDED LUMBAR 4- LUMBAR 5 LATERAL INTERBODY FUSION WITH INSTRUMENTATION AND ALLOGRAFT LUMBAR 4 - LUMBAR 5 POSTERIOR SPINAL FUSION WITH INSTRUMENTATION AND ALLOGRAFT.    Patient was given perioperative antibiotics:  Anti-infectives (From admission, onward)    Start     Dose/Rate Route Frequency Ordered Stop   01/09/22 1630  ceFAZolin (ANCEF) IVPB 2g/100 mL premix        2 g 200 mL/hr over 30 Minutes Intravenous Every 8 hours 01/09/22 1257 01/09/22 2356   01/09/22 0647  ceFAZolin (ANCEF) 2-4 GM/100ML-% IVPB       Note to Pharmacy: Gleason, Ginger E: cabinet override      01/09/22 0647 01/09/22 0908   01/09/22 0645  ceFAZolin (ANCEF) IVPB 2g/100 mL premix        2 g 200 mL/hr over 30 Minutes Intravenous On call to O.R. 01/09/22 4098 01/09/22 0847        Patient was given sequential compression devices, early ambulation to prevent DVT.  Patient benefited maximally from hospital stay and there were no complications.    Recent vital signs: BP 118/81 (BP Location: Left Arm)   Pulse 80   Temp 98 F  (36.7 C) (Oral)   Resp 17   Ht 6\' 1"  (1.854 m)   Wt 80.7 kg   SpO2 99%   BMI 23.48 kg/m    Discharge Medications:   Allergies as of 01/10/2022       Reactions   Aleve [naproxen] Anaphylaxis, Hives        Medication List     TAKE these medications    ALPRAZolam 0.25 MG tablet Commonly known as: XANAX Take 0.25 mg by mouth daily as needed for anxiety.   cyclobenzaprine 5 MG tablet Commonly known as: FLEXERIL Take 5 mg by mouth daily as needed for muscle spasms.   methocarbamol 500 MG tablet Commonly known as: ROBAXIN Take 1-2 tablets (500-1,000 mg total) by mouth every 6 (six) hours as needed for muscle spasms.   multivitamin with minerals Tabs tablet Take 1 tablet by mouth in the morning.   oxyCODONE-acetaminophen 5-325 MG tablet Commonly known as: PERCOCET/ROXICET Take 1-2 tablets by mouth every 4 (four) hours as needed for severe pain.        Diagnostic Studies: DG Lumbar Spine 2-3 Views  Result Date: 01/09/2022 CLINICAL DATA:  L4-5 fusion EXAM: LUMBAR SPINE - 2-3 VIEW COMPARISON:  08/24/2021 MRI FINDINGS: AP and lateral intraoperative views demonstrate left and posterior L4-5 fixation without acute hardware complication. IMPRESSION: Intraoperative imaging of L4-5 fixation. Electronically Signed   By: Abigail Miyamoto M.D.   On: 01/09/2022 11:36   DG C-Arm 1-60 Min-No Report  Result Date: 01/09/2022 Fluoroscopy was utilized  by the requesting physician.  No radiographic interpretation.   DG C-Arm 1-60 Min-No Report  Result Date: 01/09/2022 Fluoroscopy was utilized by the requesting physician.  No radiographic interpretation.   DG C-Arm 1-60 Min-No Report  Result Date: 01/09/2022 Fluoroscopy was utilized by the requesting physician.  No radiographic interpretation.    Disposition: Discharge disposition: 01-Home or Self Care        POD #1 s/p L4/5 lateral/posterior fusion, doing well   - up with PT/OT, encourage ambulation - Percocet for pain,  Robaxin for muscle spasms -Scripts for pain sent to pharmacy electronically  -D/C instructions sheet printed and in chart -D/C today  -F/U in office 2 weeks   Signed: Lennie Muckle Tarrah Furuta 01/24/2022, 10:13 AM

## 2023-05-23 DIAGNOSIS — Z125 Encounter for screening for malignant neoplasm of prostate: Secondary | ICD-10-CM | POA: Diagnosis not present

## 2023-05-23 DIAGNOSIS — Z13220 Encounter for screening for lipoid disorders: Secondary | ICD-10-CM | POA: Diagnosis not present

## 2023-05-23 DIAGNOSIS — Z0000 Encounter for general adult medical examination without abnormal findings: Secondary | ICD-10-CM | POA: Diagnosis not present

## 2023-07-23 ENCOUNTER — Telehealth: Payer: Self-pay

## 2023-07-23 NOTE — Telephone Encounter (Signed)
 Returned call to pt who hasn't been seen since 2022 who stated that their ha's have returned and have been taking maxalt  that was prescribed by Dr. Gracie Lav. Pt stated that these ha's returned a little over a week ago. Pt tried ibuprofen 200mg  4 tabs at once that didn't help. Pt stated that he has had to take 2 maxalt  per day for the last 3 days. Says the pain starts at the base of the head and moves up. Pt stated that the sooner he takes the maxalt  the more relief he gets. Pt stated that he has 6 more maxalt  tabs left. Do you think it would be ok to establish with np since you don't have anything? I will route to Dr. Gracie Lav as he wanted her opinion and not the Dr. Kathey Pang the work in.

## 2023-07-23 NOTE — Telephone Encounter (Signed)
 Spoke with patient - he states that his headaches have returned and he is wanting to schedule an appointment with Dr Gracie Lav. He is within the three year mark so I offered the first available appointment in June. He states that he needs something sooner.  He advised that he has been taking some rizatriptan  left over from the last time he saw Dr Gracie Lav - last RX was 11/2020( he has 6 left), he is not taking any over the counter medication as that does not help. Duration of the headache is about a week and a half.  The headache will start about 9:30-10:00 at night and then he will get them again at the morning around 6 am.   Please advise if pt can be worked in sooner for an appointment

## 2023-07-24 NOTE — Telephone Encounter (Signed)
 Scheduled with np slack for 07/29/23 at 7:45am. Pt voiced grtitude and understanding of all discussed

## 2023-07-24 NOTE — Telephone Encounter (Signed)
 Ok with NP, typical migraine, it is ok for patient to get refill through his PCP too.

## 2023-07-24 NOTE — Addendum Note (Signed)
 Addended by: Mollie Anger E on: 07/24/2023 12:15 PM   Modules accepted: Orders

## 2023-07-28 NOTE — Progress Notes (Unsigned)
 No chief complaint on file.     ASSESSMENT AND PLAN  Zachary Mueller is a 58 y.o. male Chronic migraine headache  Already on verapamil 80 mg 3 times a day as preventive medication  Add on nortriptyline  25 mg titrating to 50 mg every night  Maxalt  as needed, may mix with Tylenol , and Flexeril for prolonged severe headache (he is allergic to Aleve,)     DIAGNOSTIC DATA (LABS, IMAGING, TESTING) - I reviewed patient records, labs, notes, testing and imaging myself where available. Laboratory evaluation in June 2022, normal CBC, hemoglobin of 16, CMP creatinine of 0.85, LDL was elevated 160,  MEDICAL HISTORY:  Zachary Mueller, is a 58 year old male, seen in request by his primary care PA Denyce Flank for evaluation of chronic migraine headache  I reviewed and summarized the referring note.  I saw him in November 2018 for a cluster of significant headache, at that time, he described nightly left side severe pounding headache couple hours after he falling to sleep, woke him up, but there was no autonomic features, it was improved by amitriptyline and Depakote  as preventive medication  I personally reviewed MRI of the brain without contrast November 2018 that was normal  He tried Imitrex  at that time, was not sure about the benefit  He was headache free since then, until October 03 2020, he began to have headaches again, initially it was intermittent left-sided pressure headache, a week later, he began to have nightly severe pounding headache with light noise sensitivity, no nausea, lasting for couple hours, no autonomic symptoms, when he has headache, he wants to keep still and quiet, instead of pacing around, there was no left scalp allodynia  He was seen by primary care physician end of July, was started on verapamil 80 mg 3 times a day as preventive medication, giving Maxalt  10 mg as needed, which has helped his headache, he reported 75% improvement with Maxalt  Cut back to severity and  duration of the headache, no significant side effect noted.  Last headache he had was 3 weeks ago,  Laboratory evaluation in June 2022, showed normal CBC, CMP  Update Jul 29, 2023 SS:   PHYSICAL EXAM:   There were no vitals filed for this visit.  Not recorded     There is no height or weight on file to calculate BMI.  PHYSICAL EXAMNIATION:  Gen: NAD, conversant, well nourised, well groomed                     Cardiovascular: Regular rate rhythm, no peripheral edema, warm, nontender. Eyes: Conjunctivae clear without exudates or hemorrhage Neck: Supple, no carotid bruits. Pulmonary: Clear to auscultation bilaterally   NEUROLOGICAL EXAM:  MENTAL STATUS: Speech:    Speech is normal; fluent and spontaneous with normal comprehension.  Cognition:     Orientation to time, place and person     Normal recent and remote memory     Normal Attention span and concentration     Normal Language, naming, repeating,spontaneous speech     Fund of knowledge   CRANIAL NERVES: CN II: Visual fields are full to confrontation. Pupils are round equal and briskly reactive to light. CN III, IV, VI: extraocular movement are normal. No ptosis. CN V: Facial sensation is intact to light touch CN VII: Face is symmetric with normal eye closure  CN VIII: Hearing is normal to causal conversation. CN IX, X: Phonation is normal. CN XI: Head turning and shoulder shrug are intact  MOTOR:  There is no pronator drift of out-stretched arms. Muscle bulk and tone are normal. Muscle strength is normal.  REFLEXES: Reflexes are 2+ and symmetric at the biceps, triceps, knees, and ankles. Plantar responses are flexor.  SENSORY: Intact to light touch, pinprick and vibratory sensation are intact in fingers and toes.  COORDINATION: There is no trunk or limb dysmetria noted.  GAIT/STANCE: Posture is normal. Gait is steady with normal steps, base, arm swing, and turning. Heel and toe walking are normal. Tandem  gait is normal.  Romberg is absent.  REVIEW OF SYSTEMS:  Full 14 system review of systems performed and notable only for as above All other review of systems were negative.   ALLERGIES: Allergies  Allergen Reactions   Aleve [Naproxen] Anaphylaxis and Hives    HOME MEDICATIONS: Current Outpatient Medications  Medication Sig Dispense Refill   ALPRAZolam  (XANAX ) 0.25 MG tablet Take 0.25 mg by mouth daily as needed for anxiety.     cyclobenzaprine (FLEXERIL) 5 MG tablet Take 5 mg by mouth daily as needed for muscle spasms.     meloxicam (MOBIC) 15 MG tablet Take 15 mg by mouth daily as needed for pain.     Multiple Vitamin (MULTIVITAMIN WITH MINERALS) TABS tablet Take 1 tablet by mouth in the morning.     rizatriptan  (MAXALT ) 10 MG tablet Take 10 mg by mouth as needed for migraine (may repeat in 2 hours if headache persists). May repeat in 2 hours if needed     No current facility-administered medications for this visit.    PAST MEDICAL HISTORY: Past Medical History:  Diagnosis Date   Headache    Radiculopathy of lumbar region     PAST SURGICAL HISTORY: Past Surgical History:  Procedure Laterality Date   ANTERIOR LAT LUMBAR FUSION Right 01/09/2022   Procedure: RIGHT-SIDED LUMBAR 4- LUMBAR 5 LATERAL INTERBODY FUSION WITH INSTRUMENTATION AND ALLOGRAFT;  Surgeon: Virl Grimes, MD;  Location: [facility] OR;  Service: Orthopedics;  Laterality: Right;   BACK SURGERY  08/2016   HAND TENDON SURGERY Right     FAMILY HISTORY: Family History  Problem Relation Age of Onset   Healthy Mother    Healthy Father     SOCIAL HISTORY: Social History   Socioeconomic History   Marital status: Married    Spouse name: Not on file   Number of children: 3   Years of education: 14   Highest education level: Associate degree: academic program  Occupational History   Occupation: Nurse, adult  Tobacco Use   Smoking status: Former    Current packs/day: 0.00    Types: Cigarettes     Quit date: 2021    Years since quitting: 4.3   Smokeless tobacco: Never  Vaping Use   Vaping status: Never Used  Substance and Sexual Activity   Alcohol use: Yes    Comment: 2 beers per day   Drug use: No   Sexual activity: Not on file  Other Topics Concern   Not on file  Social History Narrative   Lives at home with his wife.   Right-handed.   4-5 cups of caffeine per day.   Social Drivers of Corporate investment banker Strain: Not on file  Food Insecurity: Low Risk  (11/04/2022)   Received from [facility]   Hunger Vital Sign    Worried About Running Out of Food in the Last Year: Never true    Ran Out of Food in the Last Year: Never true  Transportation Needs: No  Transportation Needs (11/04/2022)   Received from [facility]    In the past 12 months, has lack of reliable transportation kept you from medical appointments, meetings, work or from getting things needed for daily living? : No  Physical Activity: Not on file  Stress: Not on file  Social Connections: Unknown (11/27/2022)   Received from [facility]   Social Network    Social Network: Not on file  Intimate Partner Violence: Unknown (11/27/2022)   Received from [facility]   HITS    Physically Hurt: Not on file    Insult or Talk Down To: Not on file    Threaten Physical Harm: Not on file    Scream or Curse: Not on file   Zachary Ding, DNP  [facility] [address] [place], [place] [address] [phone]

## 2023-07-29 ENCOUNTER — Ambulatory Visit (INDEPENDENT_AMBULATORY_CARE_PROVIDER_SITE_OTHER): Admitting: Neurology

## 2023-07-29 ENCOUNTER — Encounter: Payer: Self-pay | Admitting: Neurology

## 2023-07-29 VITALS — BP 118/81 | HR 78 | Ht 73.0 in | Wt 176.0 lb

## 2023-07-29 DIAGNOSIS — G44019 Episodic cluster headache, not intractable: Secondary | ICD-10-CM

## 2023-07-29 DIAGNOSIS — G43709 Chronic migraine without aura, not intractable, without status migrainosus: Secondary | ICD-10-CM | POA: Diagnosis not present

## 2023-07-29 MED ORDER — RIZATRIPTAN BENZOATE 10 MG PO TBDP: 10.0000 mg | ORAL_TABLET | ORAL | 11 refills | Status: DC | PRN

## 2023-07-29 MED ORDER — CYCLOBENZAPRINE HCL 5 MG PO TABS: 5.0000 mg | ORAL_TABLET | Freq: Three times a day (TID) | ORAL | 1 refills | Status: AC | PRN

## 2023-07-29 NOTE — Patient Instructions (Signed)
 Great to see you today.  We will switch to Maxalt  dissolvable tablet as needed.  May combine with Flexeril 5 mg.  If headaches continue, could consider adding on verapamil.  Please feel free to reach out via MyChart if any concerns.  Follow-up in 6 months virtually.  Thanks!!  Meds ordered this encounter  Medications   rizatriptan  (MAXALT -MLT) 10 MG disintegrating tablet    Sig: Take 1 tablet (10 mg total) by mouth as needed for migraine. May repeat in 2 hours if needed    Dispense:  9 tablet    Refill:  11   cyclobenzaprine (FLEXERIL) 5 MG tablet    Sig: Take 1 tablet (5 mg total) by mouth every 8 (eight) hours as needed for muscle spasms.    Dispense:  20 tablet    Refill:  1

## 2023-07-29 NOTE — Progress Notes (Signed)
 Chart reviewed, agree above plan ?

## 2023-08-11 ENCOUNTER — Encounter: Payer: Self-pay | Admitting: Neurology

## 2023-08-11 MED ORDER — VERAPAMIL HCL 40 MG PO TABS: 40.0000 mg | ORAL_TABLET | Freq: Three times a day (TID) | ORAL | 3 refills | Status: AC

## 2023-08-11 MED ORDER — RIZATRIPTAN BENZOATE 10 MG PO TABS: 10.0000 mg | ORAL_TABLET | ORAL | 0 refills | Status: AC | PRN

## 2023-08-11 NOTE — Addendum Note (Signed)
 Addended by: Wess Hammed on: 08/11/2023 03:57 PM   Modules accepted: Orders

## 2023-08-22 DIAGNOSIS — M542 Cervicalgia: Secondary | ICD-10-CM | POA: Diagnosis not present

## 2023-08-22 DIAGNOSIS — M546 Pain in thoracic spine: Secondary | ICD-10-CM | POA: Diagnosis not present

## 2023-08-22 DIAGNOSIS — M62830 Muscle spasm of back: Secondary | ICD-10-CM | POA: Diagnosis not present

## 2023-08-25 DIAGNOSIS — M62830 Muscle spasm of back: Secondary | ICD-10-CM | POA: Diagnosis not present

## 2023-08-25 DIAGNOSIS — M542 Cervicalgia: Secondary | ICD-10-CM | POA: Diagnosis not present

## 2023-08-25 DIAGNOSIS — M546 Pain in thoracic spine: Secondary | ICD-10-CM | POA: Diagnosis not present

## 2023-08-27 DIAGNOSIS — M546 Pain in thoracic spine: Secondary | ICD-10-CM | POA: Diagnosis not present

## 2023-08-27 DIAGNOSIS — M542 Cervicalgia: Secondary | ICD-10-CM | POA: Diagnosis not present

## 2023-08-27 DIAGNOSIS — G44209 Tension-type headache, unspecified, not intractable: Secondary | ICD-10-CM | POA: Diagnosis not present

## 2023-08-27 DIAGNOSIS — M62830 Muscle spasm of back: Secondary | ICD-10-CM | POA: Diagnosis not present

## 2023-09-02 DIAGNOSIS — G44209 Tension-type headache, unspecified, not intractable: Secondary | ICD-10-CM | POA: Diagnosis not present

## 2023-09-02 DIAGNOSIS — M546 Pain in thoracic spine: Secondary | ICD-10-CM | POA: Diagnosis not present

## 2023-09-02 DIAGNOSIS — M542 Cervicalgia: Secondary | ICD-10-CM | POA: Diagnosis not present

## 2023-09-02 DIAGNOSIS — M62830 Muscle spasm of back: Secondary | ICD-10-CM | POA: Diagnosis not present

## 2023-09-04 DIAGNOSIS — M542 Cervicalgia: Secondary | ICD-10-CM | POA: Diagnosis not present

## 2023-09-04 DIAGNOSIS — M546 Pain in thoracic spine: Secondary | ICD-10-CM | POA: Diagnosis not present

## 2023-09-04 DIAGNOSIS — M62830 Muscle spasm of back: Secondary | ICD-10-CM | POA: Diagnosis not present

## 2023-09-04 DIAGNOSIS — G44209 Tension-type headache, unspecified, not intractable: Secondary | ICD-10-CM | POA: Diagnosis not present

## 2023-09-23 DIAGNOSIS — G44209 Tension-type headache, unspecified, not intractable: Secondary | ICD-10-CM | POA: Diagnosis not present

## 2023-09-23 DIAGNOSIS — M546 Pain in thoracic spine: Secondary | ICD-10-CM | POA: Diagnosis not present

## 2023-09-23 DIAGNOSIS — M62830 Muscle spasm of back: Secondary | ICD-10-CM | POA: Diagnosis not present

## 2023-09-23 DIAGNOSIS — M542 Cervicalgia: Secondary | ICD-10-CM | POA: Diagnosis not present

## 2023-09-25 DIAGNOSIS — G44209 Tension-type headache, unspecified, not intractable: Secondary | ICD-10-CM | POA: Diagnosis not present

## 2023-09-25 DIAGNOSIS — M62830 Muscle spasm of back: Secondary | ICD-10-CM | POA: Diagnosis not present

## 2023-09-25 DIAGNOSIS — M542 Cervicalgia: Secondary | ICD-10-CM | POA: Diagnosis not present

## 2023-09-25 DIAGNOSIS — M546 Pain in thoracic spine: Secondary | ICD-10-CM | POA: Diagnosis not present

## 2023-11-04 DIAGNOSIS — M542 Cervicalgia: Secondary | ICD-10-CM | POA: Diagnosis not present

## 2023-11-05 DIAGNOSIS — M47892 Other spondylosis, cervical region: Secondary | ICD-10-CM | POA: Diagnosis not present

## 2023-11-06 ENCOUNTER — Other Ambulatory Visit: Payer: Self-pay | Admitting: Neurology

## 2023-11-19 DIAGNOSIS — M47892 Other spondylosis, cervical region: Secondary | ICD-10-CM | POA: Diagnosis not present

## 2024-02-17 DIAGNOSIS — M546 Pain in thoracic spine: Secondary | ICD-10-CM | POA: Diagnosis not present

## 2024-02-17 DIAGNOSIS — M9901 Segmental and somatic dysfunction of cervical region: Secondary | ICD-10-CM | POA: Diagnosis not present

## 2024-02-17 DIAGNOSIS — M530 Cervicocranial syndrome: Secondary | ICD-10-CM | POA: Diagnosis not present

## 2024-02-17 DIAGNOSIS — M9902 Segmental and somatic dysfunction of thoracic region: Secondary | ICD-10-CM | POA: Diagnosis not present

## 2024-03-03 DIAGNOSIS — M546 Pain in thoracic spine: Secondary | ICD-10-CM | POA: Diagnosis not present

## 2024-03-03 DIAGNOSIS — M530 Cervicocranial syndrome: Secondary | ICD-10-CM | POA: Diagnosis not present

## 2024-03-03 DIAGNOSIS — M9901 Segmental and somatic dysfunction of cervical region: Secondary | ICD-10-CM | POA: Diagnosis not present

## 2024-03-03 DIAGNOSIS — M9902 Segmental and somatic dysfunction of thoracic region: Secondary | ICD-10-CM | POA: Diagnosis not present

## 2024-03-05 DIAGNOSIS — M9902 Segmental and somatic dysfunction of thoracic region: Secondary | ICD-10-CM | POA: Diagnosis not present

## 2024-03-05 DIAGNOSIS — M9901 Segmental and somatic dysfunction of cervical region: Secondary | ICD-10-CM | POA: Diagnosis not present

## 2024-03-05 DIAGNOSIS — M530 Cervicocranial syndrome: Secondary | ICD-10-CM | POA: Diagnosis not present

## 2024-03-05 DIAGNOSIS — M546 Pain in thoracic spine: Secondary | ICD-10-CM | POA: Diagnosis not present

## 2024-03-08 DIAGNOSIS — M9901 Segmental and somatic dysfunction of cervical region: Secondary | ICD-10-CM | POA: Diagnosis not present

## 2024-03-08 DIAGNOSIS — M546 Pain in thoracic spine: Secondary | ICD-10-CM | POA: Diagnosis not present

## 2024-03-08 DIAGNOSIS — M9902 Segmental and somatic dysfunction of thoracic region: Secondary | ICD-10-CM | POA: Diagnosis not present

## 2024-03-08 DIAGNOSIS — M530 Cervicocranial syndrome: Secondary | ICD-10-CM | POA: Diagnosis not present

## 2024-03-10 DIAGNOSIS — M530 Cervicocranial syndrome: Secondary | ICD-10-CM | POA: Diagnosis not present

## 2024-03-10 DIAGNOSIS — M9902 Segmental and somatic dysfunction of thoracic region: Secondary | ICD-10-CM | POA: Diagnosis not present

## 2024-03-10 DIAGNOSIS — M546 Pain in thoracic spine: Secondary | ICD-10-CM | POA: Diagnosis not present

## 2024-03-10 DIAGNOSIS — M9901 Segmental and somatic dysfunction of cervical region: Secondary | ICD-10-CM | POA: Diagnosis not present

## 2024-04-07 ENCOUNTER — Telehealth: Admitting: Neurology

## 2024-04-07 DIAGNOSIS — G44019 Episodic cluster headache, not intractable: Secondary | ICD-10-CM

## 2024-04-07 NOTE — Addendum Note (Signed)
 Addended by: GAYLAND LAURAINE PARAS on: 04/07/2024 03:29 PM   Modules accepted: Level of Service

## 2024-04-07 NOTE — Patient Instructions (Signed)
 Great to see you today! Reach out if headaches return Otherwise follow-up as needed.  Thanks

## 2024-04-07 NOTE — Progress Notes (Signed)
 "  Virtual Visit via Video Note  I connected with Zachary Mueller on 04/07/2024 at  2:45 PM EST by a video enabled telemedicine application and verified that I am speaking with the correct person using two identifiers.  Location: Patient: at his work Provider: in the office    I discussed the limitations of evaluation and management by telemedicine and the availability of in person appointments. The patient expressed understanding and agreed to proceed.  ASSESSMENT AND PLAN  Zachary Mueller is a 59 y.o. male with history of chronic migraine headache.  Historically will have headaches with migrainous features, potentially some pattern of cluster headache without autonomic features, that last for a few weeks then resolve for several years.  He was last seen in 2022.  Return of headaches in May 2025. Took verapamil  for a few weeks with headache resolution.  - Resolution of headaches - Will remain off verapamil , maxalt , but has if needed  - Return here as needed  DIAGNOSTIC DATA (LABS, IMAGING, TESTING) - I reviewed patient records, labs, notes, testing and imaging myself where available. Laboratory evaluation in June 2022, normal CBC, hemoglobin of 16, CMP creatinine of 0.85, LDL was elevated 160,  MEDICAL HISTORY:  Zachary Mueller, is a 59 year old male, seen in request by his primary care PA Lanis Mcardle for evaluation of chronic migraine headache  I reviewed and summarized the referring note.  I saw him in November 2018 for a cluster of significant headache, at that time, he described nightly left side severe pounding headache couple hours after he falling to sleep, woke him up, but there was no autonomic features, it was improved by amitriptyline and Depakote  as preventive medication  I personally reviewed MRI of the brain without contrast November 2018 that was normal  He tried Imitrex  at that time, was not sure about the benefit  He was headache free since then, until October 03 2020, he  began to have headaches again, initially it was intermittent left-sided pressure headache, a week later, he began to have nightly severe pounding headache with light noise sensitivity, no nausea, lasting for couple hours, no autonomic symptoms, when he has headache, he wants to keep still and quiet, instead of pacing around, there was no left scalp allodynia  He was seen by primary care physician end of July, was started on verapamil  80 mg 3 times a day as preventive medication, giving Maxalt  10 mg as needed, which has helped his headache, he reported 75% improvement with Maxalt  Cut back to severity and duration of the headache, no significant side effect noted.  Last headache he had was 3 weeks ago,  Laboratory evaluation in June 2022, showed normal CBC, CMP  Update Jul 29, 2023 SS: has not been seen since Sept 2022. No headaches up until 3 weeks ago, was out of town for training in [place] , felt like sinus headache, left sided occipital area, radiating forward, also left neck, shoulder blade. Comes on at night, starts at 8:30 PM, takes Maxalt  with good benefit, if catches it late, can last 1.5 hours. Does have sensitivity to light, sound. Headache cycle is getting better.  No autonomic features with headache.  Update 04/07/24 SS: He took Verapamil  for a few weeks, then headaches resolved, tapered off over a month, no return of headache! Has not needed the Maxalt .   PHYSICAL EXAM:   Virtual visit   REVIEW OF SYSTEMS:  Full 14 system review of systems performed and notable only for as above All other review of  systems were negative.   ALLERGIES: Allergies  Allergen Reactions   Aleve [Naproxen] Anaphylaxis and Hives    HOME MEDICATIONS: Current Outpatient Medications  Medication Sig Dispense Refill   ALPRAZolam  (XANAX ) 0.25 MG tablet Take 0.25 mg by mouth daily as needed for anxiety. (Patient not taking: Reported on 07/29/2023)     cyclobenzaprine  (FLEXERIL ) 5 MG tablet Take 1 tablet (5 mg  total) by mouth every 8 (eight) hours as needed for muscle spasms. 20 tablet 1   meloxicam (MOBIC) 15 MG tablet Take 15 mg by mouth daily as needed for pain. (Patient not taking: Reported on 07/29/2023)     Multiple Vitamin (MULTIVITAMIN WITH MINERALS) TABS tablet Take 1 tablet by mouth in the morning.     rizatriptan  (MAXALT ) 10 MG tablet Take 1 tablet (10 mg total) by mouth as needed for migraine. May repeat in 2 hours if needed 12 tablet 0   verapamil  (CALAN ) 40 MG tablet Take 1 tablet (40 mg total) by mouth 3 (three) times daily. 90 tablet 3   No current facility-administered medications for this visit.    PAST MEDICAL HISTORY: Past Medical History:  Diagnosis Date   Headache    Radiculopathy of lumbar region     PAST SURGICAL HISTORY: Past Surgical History:  Procedure Laterality Date   ANTERIOR LAT LUMBAR FUSION Right 01/09/2022   Procedure: RIGHT-SIDED LUMBAR 4- LUMBAR 5 LATERAL INTERBODY FUSION WITH INSTRUMENTATION AND ALLOGRAFT;  Surgeon: Beuford Anes, MD;  Location: [facility] OR;  Service: Orthopedics;  Laterality: Right;   BACK SURGERY  08/2016   HAND TENDON SURGERY Right     FAMILY HISTORY: Family History  Problem Relation Age of Onset   Healthy Mother    Healthy Father     SOCIAL HISTORY: Social History   Socioeconomic History   Marital status: Married    Spouse name: Not on file   Number of children: 3   Years of education: 14   Highest education level: Associate degree: academic program  Occupational History   Occupation: Nurse, adult  Tobacco Use   Smoking status: Former    Current packs/day: 0.00    Types: Cigarettes    Quit date: 2021    Years since quitting: 5.0   Smokeless tobacco: Never  Vaping Use   Vaping status: Never Used  Substance and Sexual Activity   Alcohol use: Yes    Comment: 2 beers per day   Drug use: No   Sexual activity: Not on file  Other Topics Concern   Not on file  Social History Narrative   Lives at home with his  wife.   Right-handed.   4-5 cups of caffeine per day.   Social Drivers of Health   Tobacco Use: Medium Risk (07/29/2023)   Patient History    Smoking Tobacco Use: Former    Smokeless Tobacco Use: Never    Passive Exposure: Not on file  Financial Resource Strain: Not on file  Food Insecurity: Low Risk (11/04/2022)   Received from [facility]   Epic    Within the past 12 months, you worried that your food would run out before you got money to buy more: Never true    Within the past 12 months, the food you bought just didn't last and you didn't have money to get more. : Never true  Transportation Needs: No Transportation Needs (11/04/2022)   Received from [facility]    In the past 12 months, has lack of reliable transportation  kept you from medical appointments, meetings, work or from getting things needed for daily living? : No  Physical Activity: Not on file  Stress: Not on file  Social Connections: Unknown (11/27/2022)   Received from [facility]   Social Network    Social Network: Not on file  Intimate Partner Violence: Unknown (11/27/2022)   Received from [facility]   HITS    Physically Hurt: Not on file    Insult or Talk Down To: Not on file    Threaten Physical Harm: Not on file    Scream or Curse: Not on file  Depression (EYV7-0): Not on file  Alcohol Screen: Not on file  Housing: Low Risk (11/04/2022)   Received from [facility]   Epic    What is your living situation today?: I have a steady place to live    Think about the place you live. Do you have problems with any of the following? Choose all that apply:: None/None on this list  Utilities: Low Risk (11/04/2022)   Received from [facility]   Utilities    In the past 12 months has the electric, gas, oil, or water company threatened to shut off services in your home? : No  Health Literacy: Not on file   Lauraine Born, SCHARLENE, DNP  [facility] [address] [place], [place] [address] [phone]  "
